# Patient Record
Sex: Female | Born: 1975 | Race: White | Hispanic: No | Marital: Single | Smoking: Never smoker
Health system: Southern US, Community
[De-identification: ages and names within clinical notes are randomized; demographics above are authoritative.]

## PROBLEM LIST (undated history)

## (undated) DIAGNOSIS — Z9889 Other specified postprocedural states: Secondary | ICD-10-CM

## (undated) DIAGNOSIS — J343 Hypertrophy of nasal turbinates: Secondary | ICD-10-CM

## (undated) DIAGNOSIS — R112 Nausea with vomiting, unspecified: Secondary | ICD-10-CM

## (undated) DIAGNOSIS — J342 Deviated nasal septum: Secondary | ICD-10-CM

## (undated) DIAGNOSIS — Z8489 Family history of other specified conditions: Secondary | ICD-10-CM

---

## 2001-02-09 HISTORY — PX: LASIK: SHX215

## 2011-01-19 ENCOUNTER — Encounter: Payer: Self-pay | Admitting: *Deleted

## 2011-01-19 ENCOUNTER — Emergency Department (HOSPITAL_COMMUNITY)
Admission: EM | Admit: 2011-01-19 | Discharge: 2011-01-20 | Disposition: A | Discharge status: Home / Self Care | Payer: BC Managed Care – PPO | Attending: Emergency Medicine | Admitting: Emergency Medicine

## 2011-01-19 DIAGNOSIS — R10811 Right upper quadrant abdominal tenderness: Secondary | ICD-10-CM | POA: Insufficient documentation

## 2011-01-19 DIAGNOSIS — Z79899 Other long term (current) drug therapy: Secondary | ICD-10-CM | POA: Insufficient documentation

## 2011-01-19 DIAGNOSIS — R1011 Right upper quadrant pain: Secondary | ICD-10-CM | POA: Insufficient documentation

## 2011-01-19 DIAGNOSIS — R110 Nausea: Secondary | ICD-10-CM | POA: Insufficient documentation

## 2011-01-19 DIAGNOSIS — M62838 Other muscle spasm: Secondary | ICD-10-CM | POA: Insufficient documentation

## 2011-01-19 DIAGNOSIS — R12 Heartburn: Secondary | ICD-10-CM | POA: Insufficient documentation

## 2011-01-19 LAB — URINALYSIS, ROUTINE W REFLEX MICROSCOPIC
Bilirubin Urine: NEGATIVE
Ketones, ur: NEGATIVE mg/dL
Leukocytes, UA: NEGATIVE
Nitrite: NEGATIVE
Specific Gravity, Urine: 1.006 (ref 1.005–1.030)
Urobilinogen, UA: 0.2 mg/dL (ref 0.0–1.0)
pH: 7 (ref 5.0–8.0)

## 2011-01-19 NOTE — ED Notes (Signed)
Right lower abdominal pressure,  right upper sharp abdominal pain, and nausea for two days

## 2011-01-20 ENCOUNTER — Emergency Department (HOSPITAL_COMMUNITY): Payer: BC Managed Care – PPO

## 2011-01-20 LAB — DIFFERENTIAL
Basophils Absolute: 0.1 10*3/uL (ref 0.0–0.1)
Basophils Relative: 1 % (ref 0–1)
Eosinophils Absolute: 0.1 10*3/uL (ref 0.0–0.7)
Eosinophils Relative: 1 % (ref 0–5)
Neutrophils Relative %: 46 % (ref 43–77)

## 2011-01-20 LAB — COMPREHENSIVE METABOLIC PANEL
ALT: 11 U/L (ref 0–35)
AST: 14 U/L (ref 0–37)
Albumin: 4.4 g/dL (ref 3.5–5.2)
Calcium: 9.5 mg/dL (ref 8.4–10.5)
GFR calc Af Amer: >90 mL/min (ref >90)
Potassium: 3.4 mEq/L — ABNORMAL LOW (ref 3.5–5.1)
Sodium: 138 mEq/L (ref 135–145)
Total Protein: 6.9 g/dL (ref 6.0–8.3)

## 2011-01-20 LAB — CBC
MCH: 32.6 pg (ref 26.0–34.0)
MCV: 90.2 fL (ref 78.0–100.0)
Platelets: 281 10*3/uL (ref 150–400)
RDW: 12.1 % (ref 11.5–15.5)

## 2011-01-20 MED ORDER — ONDANSETRON HCL 4 MG PO TABS: 4.0000 mg | ORAL_TABLET | Freq: Four times a day (QID) | ORAL | Status: DC

## 2011-01-20 MED ORDER — HYDROMORPHONE HCL PF 1 MG/ML IJ SOLN
1.0000 mg | Freq: Once | INTRAMUSCULAR | Status: AC
  Administered 2011-01-20: 1 mg via INTRAVENOUS
  Filled 2011-01-20: qty 1

## 2011-01-20 MED ORDER — SODIUM CHLORIDE 0.9 % IV BOLUS (SEPSIS)
1000.0000 mL | Freq: Once | INTRAVENOUS | Status: AC
  Administered 2011-01-20: 1000 mL via INTRAVENOUS

## 2011-01-20 MED ORDER — ONDANSETRON HCL 4 MG/2ML IJ SOLN
INTRAMUSCULAR | Status: AC
  Filled 2011-01-20: qty 2

## 2011-01-20 MED ORDER — ONDANSETRON HCL 4 MG/2ML IJ SOLN
4.0000 mg | Freq: Once | INTRAMUSCULAR | Status: AC
  Administered 2011-01-20: 4 mg via INTRAVENOUS
  Filled 2011-01-20: qty 2

## 2011-01-20 MED ORDER — IOHEXOL 300 MG/ML  SOLN
100.0000 mL | Freq: Once | INTRAMUSCULAR | Status: AC | PRN
  Administered 2011-01-20: 100 mL via INTRAVENOUS

## 2011-01-20 MED ORDER — PANTOPRAZOLE SODIUM 20 MG PO TBEC: 20.0000 mg | DELAYED_RELEASE_TABLET | Freq: Every day | ORAL | Status: DC

## 2011-01-20 MED ORDER — HYDROCODONE-ACETAMINOPHEN 5-500 MG PO TABS: 1.0000 | ORAL_TABLET | Freq: Four times a day (QID) | ORAL | Status: AC | PRN

## 2011-01-20 MED ORDER — PANTOPRAZOLE SODIUM 40 MG IV SOLR
40.0000 mg | Freq: Once | INTRAVENOUS | Status: AC
  Administered 2011-01-20: 40 mg via INTRAVENOUS
  Filled 2011-01-20: qty 40

## 2011-01-20 MED ORDER — ONDANSETRON HCL 4 MG/2ML IJ SOLN
4.0000 mg | Freq: Once | INTRAMUSCULAR | Status: AC
  Administered 2011-01-20: 4 mg via INTRAVENOUS

## 2011-01-20 NOTE — ED Provider Notes (Signed)
History     CSN: [id number] Arrival date & time: 01/19/2011  6:08 PM   First MD Initiated Contact with Patient 01/20/11 0128      Chief Complaint  Patient presents with  . Abdominal Pain    gallbladder    (Consider location/radiation/quality/duration/timing/severity/associated sxs/prior treatment) Patient is a 35 y.o. female presenting with abdominal pain. The history is provided by the patient.  Abdominal Pain The primary symptoms of the illness include abdominal pain. The current episode started yesterday. The onset of the illness was gradual. The problem has been gradually worsening.  Associated with: Started around 3 AM right side abdomen. Additional symptoms associated with the illness include heartburn. Symptoms associated with the illness do not include chills, anorexia, diaphoresis, constipation, urgency, hematuria, frequency or back pain. Significant associated medical issues do not include inflammatory bowel disease, diabetes or liver disease.   Sharp in nature mostly right upper quadrant also epigastric and left upper quadrant. No radiation to her back. No aggravating or alleviating factors. Pain does not seem to be associated with eating. Patient is worried about her gallbladder with family history of gallbladder disease. No fevers or trauma. She has had some recent heartburn and reflux. Patient had one episode of this pain last week that lasted less than a day. This episode started yesterday and has been constant since onset. Has associated nausea no vomiting  Past Medical History  Diagnosis Date  . Sinus infection     History reviewed. No pertinent past surgical history.  No family history on file.  History  Substance Use Topics  . Smoking status: Never Smoker   . Smokeless tobacco: Not on file  . Alcohol Use: No    OB History    Grav Para Term Preterm Abortions TAB SAB Ect Mult Living                  Review of Systems  Constitutional: Negative for chills  and diaphoresis.  Gastrointestinal: Positive for heartburn and abdominal pain. Negative for constipation and anorexia.  Genitourinary: Negative for urgency, frequency and hematuria.  Musculoskeletal: Negative for back pain.    Allergies  Ceclor; Codeine; and Erythromycin  Home Medications   Current Outpatient Rx  Name Route Sig Dispense Refill  . VITAMIN C 1000 MG PO TABS Oral Take 1,000 mg by mouth daily.      Marland Kitchen FLUTICASONE PROPIONATE 50 MCG/ACT NA SUSP Nasal Place 2 sprays into the nose daily as needed. Allergies     . GUAIFENESIN 400 MG PO TABS Oral Take 400 mg by mouth every 4 (four) hours as needed. Sinus infection.     . IBUPROFEN 200 MG PO TABS Oral Take 400 mg by mouth every 6 (six) hours as needed. Pain.     Marland Kitchen LORATADINE 10 MG PO TABS Oral Take 10 mg by mouth daily.      Marland Kitchen POLYETHYL GLYCOL-PROPYL GLYCOL 0.4-0.3 % OP SOLN Ophthalmic Apply 1 drop to eye daily as needed. Dry eyes....     . SALINE NASAL SPRAY 0.65 % NA SOLN Nasal Place 1 spray into the nose daily.        BP 103/53  Pulse 40  Temp(Src) 97.8 F (36.6 C) (Oral)  Resp 18  SpO2 100%  LMP 01/17/2011  Physical Exam  Constitutional: She is oriented to person, place, and time. She appears well-developed and well-nourished.  HENT:  Head: Normocephalic and atraumatic.  Eyes: Conjunctivae and EOM are normal. Pupils are equal, round, and reactive to light.  Neck: Trachea normal. Neck supple. No thyromegaly present.  Cardiovascular: Normal rate, regular rhythm, S1 normal, S2 normal and normal pulses.     No systolic murmur is present   No diastolic murmur is present  Pulses:      Radial pulses are 2+ on the right side, and 2+ on the left side.  Pulmonary/Chest: Effort normal and breath sounds normal. She has no wheezes. She has no rhonchi. She has no rales. She exhibits no tenderness.  Abdominal: Soft. Normal appearance and bowel sounds are normal. There is tenderness. There is no rebound, no CVA tenderness and  negative Murphy's sign.       Tender right upper quadrant with voluntary guarding no rebound.  Musculoskeletal:       BLE:s Calves nontender, no cords or erythema, negative Homans sign  Neurological: She is alert and oriented to person, place, and time. She has normal strength. No cranial nerve deficit or sensory deficit. GCS eye subscore is 4. GCS verbal subscore is 5. GCS motor subscore is 6.  Skin: Skin is warm and dry. No rash noted. She is not diaphoretic.  Psychiatric: Her speech is normal.       Cooperative and appropriate    ED Course  Procedures (including critical care time)  Labs Reviewed  CBC - Abnormal; Notable for the following:    MCHC 36.1 (*)    All other components within normal limits  COMPREHENSIVE METABOLIC PANEL - Abnormal; Notable for the following:    Potassium 3.4 (*)    All other components within normal limits  URINALYSIS, ROUTINE W REFLEX MICROSCOPIC  POCT PREGNANCY, URINE  DIFFERENTIAL  LIPASE, BLOOD  POCT PREGNANCY, URINE   US Abdomen Complete  01/20/2011  *RADIOLOGY REPORT*  Clinical Data:  Right upper quadrant abdominal pain.  ABDOMINAL ULTRASOUND COMPLETE  Comparison:  None  Findings:  Gallbladder:  The gallbladder is normal in appearance, without evidence for gallstones, gallbladder wall thickening or pericholecystic fluid.  No ultrasonographic Murphy's sign is elicited.  The fundus is not well characterized due to overlying bowel gas.  Common Bile Duct:  0.5 cm in diameter; within normal limits in caliber.  Liver: There is a mild "starry night" appearance to the liver, which could reflect mild hepatitis; suggest clinical correlation. Hepatic echogenicity otherwise remains within normal limits; no focal lesions identified.  Limited Doppler evaluation demonstrates normal blood flow within the liver.  IVC:  Unremarkable in appearance.  Pancreas:  Although the pancreas is difficult to visualize in its entirety due to overlying bowel gas, no focal pancreatic  abnormality is identified.  Spleen:  8.1 cm in length; within normal limits in size and echotexture.  Right kidney:  10.6 cm in length; normal in size, configuration and parenchymal echogenicity.  No evidence of mass or hydronephrosis.  Left kidney:  10.5 cm in length; normal in size, configuration and parenchymal echogenicity.  No evidence of mass or hydronephrosis.  Abdominal Aorta:  Normal in caliber; no aneurysm identified.  IMPRESSION:  1.  Gallbladder unremarkable in appearance. 2.  Mild "starry night" appearance to the liver may reflect mild hepatitis; suggest clinical correlation.  Original Report Authenticated By: Tonia Ghent, M.D.   Ct Abdomen Pelvis W Contrast  01/20/2011  *RADIOLOGY REPORT*  Clinical Data: Right-sided abdominal pain and right upper quadrant muscle spasms.  Nausea.  CT ABDOMEN AND PELVIS WITH CONTRAST  Technique:  Multidetector CT imaging of the abdomen and pelvis was performed following the standard protocol during bolus administration of intravenous contrast.  Contrast: OMNIPAQUE IOHEXOL 300 MG/ML IV SOLN  Comparison: Abdominal ultrasound performed earlier today at 02:10 a.m.  Findings: Minimal bibasilar atelectasis is noted.  There is diffuse periportal edema.  Trace fluid is noted about the inferior tip of the liver.  There is also significant pericholecystic fluid, which was not present on the recent abdominal ultrasound.  Given prominence of the IVC and recent history of rehydration, this may reflect volume repletion.  The unusual mildly heterogeneous appearance to hepatic enhancement may also reflect volume repletion, as lab values demonstrate no evidence for hepatitis.  The spleen is unremarkable in appearance.  The adrenal glands and pancreas are within normal limits.  The gallbladder is otherwise unremarkable.  The kidneys are unremarkable in appearance.  There is no evidence of hydronephrosis.  No renal or ureteral stones are seen.  No perinephric stranding is  appreciated.  No free fluid is identified.  The small bowel is unremarkable in appearance.  The stomach is filled with contrast and is within normal limits.  No acute vascular abnormalities are seen.  The appendix is normal in caliber, without evidence for appendicitis.  The colon is partially decompressed; mild diverticulosis is noted at the proximal sigmoid colon, without evidence for diverticulitis.  The bladder is mildly distended and unremarkable in appearance. The uterus is within normal limits.  A tampon is noted at the vagina.  The ovaries are relatively symmetric; no suspicious adnexal masses are seen.  No inguinal lymphadenopathy is seen.  No acute osseous abnormalities are identified.  IMPRESSION:  1.  Unusual mildly heterogeneous appearance to hepatic parenchymal enhancement; this may reflect volume repletion as described above, since lab values demonstrate no evidence for hepatitis. 2.  Significant pericholecystic fluid, new from the recent abdominal ultrasound; this is nonspecific in the presence of trace ascites about the liver.  Gallbladder otherwise unremarkable in appearance. 3.  Diffuse periportal edema; this likely reflects volume repletion. 4.  Mild diverticulosis at the proximal sigmoid colon, without evidence for diverticulitis.  Original Report Authenticated By: Tonia Ghent, M.D.     Diagnosis: Abdominal pain   MDM   Patient with right upper quadrant abdominal pain, evaluated with ultrasound and labs as above. Ultrasound unremarkable and still in significant pain. IV narcotics and fluids. By mouth contrast and CT scan as above. At 6:50 AM patient notified of CT results, his feeling much better, and is stable for discharge home. Outpatient referral and followup recommended. Patient states understanding strict return precautions        Sunnie Nielsen, MD 01/20/11 0700

## 2011-01-20 NOTE — ED Notes (Signed)
Pt c/o ruq pain, onset was several weeks ago, pt believes r/t gallbladder, onset after "greasy food"

## 2011-01-20 NOTE — ED Notes (Signed)
U/S bedside, cont to monitor

## 2011-01-22 ENCOUNTER — Telehealth: Payer: Self-pay | Admitting: Internal Medicine

## 2011-01-22 NOTE — Telephone Encounter (Signed)
Spoke with pt' sister, Inetta Fermo, and informed her I have pt scheduled for Friday, 03/27/10 at 10:45am. I will call if an opening occurs. Inetta Fermo stated understanding.

## 2011-01-23 ENCOUNTER — Encounter: Payer: Self-pay | Admitting: Internal Medicine

## 2011-01-24 ENCOUNTER — Telehealth: Payer: Self-pay | Admitting: Internal Medicine

## 2011-01-24 ENCOUNTER — Other Ambulatory Visit (INDEPENDENT_AMBULATORY_CARE_PROVIDER_SITE_OTHER): Payer: BC Managed Care – PPO

## 2011-01-24 ENCOUNTER — Encounter: Payer: Self-pay | Admitting: Internal Medicine

## 2011-01-24 ENCOUNTER — Ambulatory Visit (INDEPENDENT_AMBULATORY_CARE_PROVIDER_SITE_OTHER): Payer: BC Managed Care – PPO | Admitting: Internal Medicine

## 2011-01-24 VITALS — BP 102/58 | HR 60 | Ht 63.0 in | Wt 156.2 lb

## 2011-01-24 DIAGNOSIS — R1013 Epigastric pain: Secondary | ICD-10-CM

## 2011-01-24 DIAGNOSIS — R112 Nausea with vomiting, unspecified: Secondary | ICD-10-CM

## 2011-01-24 LAB — CBC WITH DIFFERENTIAL/PLATELET
Basophils Relative: 0.7 % (ref 0.0–3.0)
Eosinophils Absolute: 0 10*3/uL (ref 0.0–0.7)
HCT: 36.6 % (ref 36.0–46.0)
Hemoglobin: 12.5 g/dL (ref 12.0–15.0)
Lymphocytes Relative: 35.7 % (ref 12.0–46.0)
Lymphs Abs: 1.4 10*3/uL (ref 0.7–4.0)
MCHC: 34.2 g/dL (ref 30.0–36.0)
MCV: 96.4 fl (ref 78.0–100.0)
Neutro Abs: 2.2 10*3/uL (ref 1.4–7.7)
RBC: 3.8 Mil/uL — ABNORMAL LOW (ref 3.87–5.11)

## 2011-01-24 LAB — COMPREHENSIVE METABOLIC PANEL
AST: 14 U/L (ref 0–37)
Albumin: 4 g/dL (ref 3.5–5.2)
Alkaline Phosphatase: 45 U/L (ref 39–117)
BUN: 5 mg/dL — ABNORMAL LOW (ref 6–23)
Calcium: 8.9 mg/dL (ref 8.4–10.5)
Chloride: 108 mEq/L (ref 96–112)
Glucose, Bld: 85 mg/dL (ref 70–99)
Potassium: 3.7 mEq/L (ref 3.5–5.1)
Sodium: 145 mEq/L (ref 135–145)
Total Protein: 6.6 g/dL (ref 6.0–8.3)

## 2011-01-24 MED ORDER — ONDANSETRON HCL 4 MG PO TABS: 4.0000 mg | ORAL_TABLET | Freq: Four times a day (QID) | ORAL | Status: AC | PRN

## 2011-01-24 MED ORDER — HYOSCYAMINE SULFATE 0.125 MG SL SUBL: 0.1250 mg | SUBLINGUAL_TABLET | SUBLINGUAL | Status: AC | PRN

## 2011-01-24 MED ORDER — PANTOPRAZOLE SODIUM 20 MG PO TBEC: 40.0000 mg | DELAYED_RELEASE_TABLET | Freq: Every day | ORAL | Status: DC

## 2011-01-24 NOTE — Progress Notes (Signed)
Subjective:    Patient ID: Kimberly Gonzalez, female    DOB: October 29, 1975, 35 y.o.   MRN: [id number]  HPI Kimberly Gonzalez is a 35 yo female with limited past medical history who presents today with her sister on referral from the emergency department for evaluation of epigastric pain.  The patient states her symptoms started approximately 3 or 4 months ago. She reports her first episode woke her from sleep at around 3 AM. At that time she reports epigastric "muscle spasm pain". She describes this pain is extremely intense and associated with nausea and vomiting. The pain seemed to build and lasted about 12 hours. It resolved and left her with soreness. The pain again occurred about 2 months later again in the middle of the night, with epigastric pain, nausea and vomiting.  She then had a period with no pain until mid-October 2012 1 again the middle of the night, she developed pain nausea and vomiting. She specifically remembers eating greasy foods prior to the pain starting and she wonders if there is any relation. She was seen by her PCP and advised that should the pain return she should seek evaluation. Then on around November 7 she again developed abdominal pain primarily in the epigastrium and right upper quadrant. There was no nausea and vomiting associated with this episode. She was seen in the ED and underwent ultrasound and CT scan. She reports at that time labs were normal.  Currently she is not hurting, but is concerned that the pain may return. She reports her appetite overall has been poor, but she has been able to eat. She has not lost weight, in fact she has gained weight. She denies early satiety. She has noted some heartburn recently (over the past month), but no dysphagia or odynophagia.  Her bowel movements have been relatively normal for her, though occasionally slightly loose. No melena or bright red blood per rectum. No diarrhea.  Recently she was given a prescription for pantoprazole 20 mg  daily, but she's only taken this for 2 days. She has used promethazine as needed for nausea with some relief. She also has tried ibuprofen for epigastric abdominal pain without much help. She was given a prescription for Vicodin for severe pain which she has used infrequently with some benefit.   Review of Systems Constitutional: Negative for fever, chills, night sweats, activity change HEENT: Negative for sore throat, mouth sores and trouble swallowing. Eyes: Negative for visual disturbance Respiratory: Negative for cough, chest tightness and shortness of breath Cardiovascular: Negative for chest pain, palpitations and lower extremity swelling Gastrointestinal: See history of present illness Genitourinary: Negative for dysuria and hematuria. Musculoskeletal: Negative for back pain, arthralgias and myalgias Skin: Negative for rash or color change Neurological: Negative for headaches, weakness, numbness Hematological: Negative for adenopathy, negative for easy bruising/bleeding Psychiatric/behavioral: Negative for depressed mood, negative for anxiety  PMH: None   Social History  . Marital Status: Single   Occupational History  . Data processing manager at [employer]    Social History Main Topics  . Smoking status: Never Smoker   . Smokeless tobacco: Never Used  . Alcohol Use: No  . Drug Use: No   Family History  Problem Relation Age of Onset  . Diabetes Father   . Kidney disease Sister   . Leukemia Father   . Diabetes Paternal Aunt   . Diabetes Paternal Uncle       Objective:   Physical Exam BP 102/58  Pulse 60  Ht 5\' 3"  (1.6 m)  Wt 70.852 kg (156 lb 3.2 oz)  BMI 27.67 kg/m2  LMP 01/17/2011 Constitutional: Well-developed and well-nourished. No distress. HEENT: Normocephalic and atraumatic. Oropharynx is clear and moist. No oropharyngeal exudate. Conjunctivae are normal. Pupils are equal round and reactive to light. No scleral icterus. Neck: Neck supple. Trachea  midline. Cardiovascular: Normal rate, regular rhythm and intact distal pulses. No M/R/G Pulmonary/chest: Effort normal and breath sounds normal. No wheezing, rales or rhonchi. Abdominal: Soft, mild epigastric and right upper quadrant pain without rebound or guarding, nondistended. Bowel sounds active throughout. There are no masses palpable. No hepatosplenomegaly. Extremities: no clubbing, cyanosis, or edema Lymphadenopathy: No cervical adenopathy noted. Neurological: Alert and oriented to person place and time. Skin: Skin is warm and dry. No rashes noted. Psychiatric: Normal mood and affect. Behavior is normal.  CMP     Component Value Date/Time   NA 145 01/24/2011 1250   K 3.7 01/24/2011 1250   CL 108 01/24/2011 1250   CO2 28 01/24/2011 1250   GLUCOSE 85 01/24/2011 1250   BUN 5* 01/24/2011 1250   CREATININE 0.7 01/24/2011 1250   CALCIUM 8.9 01/24/2011 1250   PROT 6.6 01/24/2011 1250   ALBUMIN 4.0 01/24/2011 1250   AST 14 01/24/2011 1250   ALT 12 01/24/2011 1250   ALKPHOS 45 01/24/2011 1250   BILITOT 0.6 01/24/2011 1250   GFRNONAA >90 01/20/2011 0150   GFRAA >90 01/20/2011 0150    CBC    Component Value Date/Time   WBC 4.0* 01/24/2011 1250   RBC 3.80* 01/24/2011 1250   HGB 12.5 01/24/2011 1250   HCT 36.6 01/24/2011 1250   PLT 256.0 01/24/2011 1250   MCV 96.4 01/24/2011 1250   MCH 32.6 01/20/2011 0150   MCHC 34.2 01/24/2011 1250   RDW 12.8 01/24/2011 1250   LYMPHSABS 1.4 01/24/2011 1250   MONOABS 0.3 01/24/2011 1250   EOSABS 0.0 01/24/2011 1250   BASOSABS 0.0 01/24/2011 1250    Imaging: RUQ [place] 01/20/11 1. gallbladder are unremarkable in appearance. 2. mild "starry night" appearance of the liver may reflect mild hepatitis, suggest clinical correlation  CT abdomen and pelvis with contrast 01/20/2011 1. unusual mildly heterogeneous appearance of the hepatic parenchymal enhancement; this may reflect volume repletion as described above, since lab values demonstrate  no evidence for hepatitis. 2. significant pericholecystic fluid, new from the recent abdominal ultrasound; this is nonspecific in the presence of trace ascites about the liver. Gallbladder otherwise unremarkable in appearance. 3. Diffuse periportal edema; this likely reflects volume repletion 4. mild diverticulosis at the proximal sigmoid colon, without evidence for diverticulitis     Assessment & Plan:  35 yo female with limited past medical history who presents today with her sister on referral from the emergency department for evaluation of epigastric pain  1. Abdominal pain/N/V -- the patient's abdominal pain and nausea and vomiting have been very episodic in nature. Interestingly her labs have been normal, and remain so today (comprehensive metabolic panel and CBC results available above from today) .  Her pain certainly could be biliary in nature, but her gallbladder  was normal by ultrasound with no evidence of stones. She did have pericholecystic fluid on the CT scan, but this was done just after the ultrasound, raising suspicion that fluid administration adequate to this finding.  The changes seen on imaging in her liver are not correlated with abnormal liver function tests, therefore making this finding nonspecific. At present, I think the differential includes acid peptic disease versus gallbladder disease. I agree with  PPI therapy, and will increase her dose of pantoprazole to 40 mg daily. We discussed how best to take this medication, specifically 30 minutes to one hour before breakfast. She can continue to use antibiotics, and I will give her prescription for Zofran as this is likely less sedating than promethazine. I also will give her a prescription for Levsin to be used as needed for spasm/pain. Finally I recommended CCK-HIDA to assess gallbladder function and to see if CCK administration reproduces her pain. If this test is abnormal or her pain is reproduced, then I will refer her to  general surgery for consideration of cholecystectomy. Also we will perform upper endoscopy to rule out acid peptic disease in her stomach or proximal small bowel. I've asked that she call me should her symptoms worsen prior to the testing ordered today.

## 2011-01-24 NOTE — Patient Instructions (Addendum)
You have been scheduled for a HIDA scan at [facility] on 02/08/2011 @ 2:00pm arrive 15 min. Prior to your appointment. You are to have nothing to eat or drink 6 hours prior to your appointment. If you need to cancel or resschedule please call  department at [phone] between the hours of 8:00 am and 5:00 pm, Monday-Friday.  Your physician has requested that you go to the basement for lab work before leaving today.  We have sent the following medications to your pharmacy for you to pick up at your convenience: levisen. Protonix and Zofran.

## 2011-01-25 ENCOUNTER — Telehealth: Payer: Self-pay | Admitting: Internal Medicine

## 2011-01-25 ENCOUNTER — Telehealth: Payer: Self-pay | Admitting: Gastroenterology

## 2011-01-25 ENCOUNTER — Telehealth: Payer: Self-pay | Admitting: *Deleted

## 2011-01-25 NOTE — Telephone Encounter (Signed)
This call was from the pt's sister and I gave her the results.

## 2011-01-25 NOTE — Telephone Encounter (Signed)
Message copied by Florene Glen on Thu Jan 25, 2011  2:35 PM ------      Message from: Beverley Fiedler      Created: Wed Jan 24, 2011  4:28 PM       Labs remain normal.  WBC  Count technically low, but not dangerously so and this is likely normal for her.      Liver test very normal.  Thyroid study normal.

## 2011-01-25 NOTE — Telephone Encounter (Signed)
Informed pt of Dr Lauro Franklin interpretation of her labs; pt stated understanding and will have a CCK HIDA scan on 01/29/11 and possible EGD.

## 2011-01-25 NOTE — Telephone Encounter (Signed)
Spoke to pt. Confirmed her appt. At [facility] for a CCK-Hida scan at 1pm. Let her know she needs to arrive at 12:45pm and NPO after 7am. She understood.

## 2011-01-26 ENCOUNTER — Ambulatory Visit: Payer: BC Managed Care – PPO | Admitting: Gastroenterology

## 2011-01-29 ENCOUNTER — Encounter (HOSPITAL_COMMUNITY)
Admission: RE | Admit: 2011-01-29 | Discharge: 2011-01-29 | Disposition: A | Discharge status: Home / Self Care | Payer: BC Managed Care – PPO | Source: Ambulatory Visit | Attending: Internal Medicine | Admitting: Internal Medicine

## 2011-01-29 DIAGNOSIS — R1013 Epigastric pain: Secondary | ICD-10-CM | POA: Insufficient documentation

## 2011-01-29 MED ORDER — TECHNETIUM TC 99M MEBROFENIN IV KIT
5.0000 | PACK | Freq: Once | INTRAVENOUS | Status: AC | PRN
  Administered 2011-01-29: 5 via INTRAVENOUS

## 2011-01-29 MED ORDER — SINCALIDE 5 MCG IJ SOLR
INTRAMUSCULAR | Status: AC
  Administered 2011-01-29: 1.36 ug via INTRAVENOUS
  Filled 2011-01-29: qty 5

## 2011-01-30 ENCOUNTER — Telehealth: Payer: Self-pay | Admitting: *Deleted

## 2011-01-30 NOTE — Telephone Encounter (Signed)
Pt's sister called to see if Hida scan showed anything so she could cancel the EGD if he Gall Bladder is her problem. Notified Inetta Fermo that the Hida scan was normal; unless something changes she will have her EGD next week.

## 2011-02-06 ENCOUNTER — Ambulatory Visit (AMBULATORY_SURGERY_CENTER): Payer: BC Managed Care – PPO | Admitting: Internal Medicine

## 2011-02-06 ENCOUNTER — Encounter: Payer: Self-pay | Admitting: Internal Medicine

## 2011-02-06 DIAGNOSIS — R112 Nausea with vomiting, unspecified: Secondary | ICD-10-CM

## 2011-02-06 DIAGNOSIS — R1013 Epigastric pain: Secondary | ICD-10-CM

## 2011-02-06 MED ORDER — SODIUM CHLORIDE 0.9 % IV SOLN: 500.0000 mL | INTRAVENOUS | Status: DC

## 2011-02-06 NOTE — Progress Notes (Signed)
Patient did not experience any of the following events: a burn prior to discharge; a fall within the facility; wrong site/side/patient/procedure/implant event; or a hospital transfer or hospital admission upon discharge from the facility. (G8907) Patient did not have preoperative order for IV antibiotic SSI prophylaxis. ([id number])  

## 2011-02-06 NOTE — Patient Instructions (Signed)
Discharge instructions per blue and green sheets Handout on hiatal hernia Continue your reflux medicine daily as directed. Take this 20-30 minutes before your first meal of the day If the pain recurs, please contact dr pyrtles office at [phone].

## 2011-02-07 ENCOUNTER — Telehealth: Payer: Self-pay | Admitting: *Deleted

## 2011-02-07 NOTE — Telephone Encounter (Signed)

## 2011-02-08 ENCOUNTER — Other Ambulatory Visit (HOSPITAL_COMMUNITY): Payer: BC Managed Care – PPO

## 2011-03-09 ENCOUNTER — Other Ambulatory Visit: Payer: Self-pay | Admitting: Internal Medicine

## 2011-03-09 MED ORDER — PANTOPRAZOLE SODIUM 40 MG PO TBEC: DELAYED_RELEASE_TABLET | ORAL | Status: DC

## 2011-03-09 NOTE — Telephone Encounter (Signed)
Dr Rhea Belton, pt stated she has had no more problems, but she has watched her diet and held off on the [place] food. She did 30 days of Protonix 40mg  bid; should she continue this? Thanks.

## 2011-03-09 NOTE — Telephone Encounter (Addendum)
Pt with a normal EGD on 02/05/11 and note stated for her to continue to take her PPI 30 minutes prior to meals; she is to contact our ofc if abdominal pain recurs.

## 2011-03-09 NOTE — Telephone Encounter (Signed)
lmom at [phone] per Dr Rhea Belton, she can continue the Protonix if symptoms persist and only once a day. Ordered med at [facility] and she can pick it up or keep it on hold in case she needs it. She may call back for further questions.

## 2011-03-09 NOTE — Telephone Encounter (Signed)
I recommend she only continue Protonix if symptoms persist. Once a day is likely sufficient if her symptoms return.

## 2011-03-09 NOTE — Telephone Encounter (Signed)
See previous note

## 2011-05-03 NOTE — Telephone Encounter (Signed)
completed

## 2011-08-09 ENCOUNTER — Telehealth: Payer: Self-pay | Admitting: Internal Medicine

## 2011-08-09 NOTE — Telephone Encounter (Signed)
See if she can be seen by [facility] tomorrow, thanks.  Continue protonix 40 mg daily.

## 2011-08-09 NOTE — Telephone Encounter (Signed)
Pt has not been seen since November of 2012. She had episodes of upper epigastric/abdominal pain and nothing was ever found. Negative CCK HIDA an only a small HH on EGD. She was told to f/u if muscle spasms started again. She states she was told to take Protonix PRN. Pt reports she has watched her diet and cut out OJ, coffee and fried foods and hadn't been taking any protonix. 1st weekend in May, she ate cheese sticks and woke up in the middle of the night at 4am with the same kind of pain and spasms. She will have nausea and vomit for hours and have the cramping in her upper l&r area below her breasts. The episodes will be followed by BMs that are softer, not diarrhea. She had another episode yesterday after having OJ and [employer]. She got the Protonix refilled and has been taking that and she hasn't had the vomiting she normally has; could be coincidence. Pt is willing to come in or do further testing; please advise. Thanks.

## 2011-08-10 ENCOUNTER — Ambulatory Visit (INDEPENDENT_AMBULATORY_CARE_PROVIDER_SITE_OTHER): Payer: BC Managed Care – PPO | Admitting: Nurse Practitioner

## 2011-08-10 ENCOUNTER — Encounter: Payer: Self-pay | Admitting: Nurse Practitioner

## 2011-08-10 ENCOUNTER — Other Ambulatory Visit (INDEPENDENT_AMBULATORY_CARE_PROVIDER_SITE_OTHER): Payer: BC Managed Care – PPO

## 2011-08-10 VITALS — BP 112/72 | HR 77 | Ht 63.0 in | Wt 149.8 lb

## 2011-08-10 DIAGNOSIS — R933 Abnormal findings on diagnostic imaging of other parts of digestive tract: Secondary | ICD-10-CM

## 2011-08-10 DIAGNOSIS — R1013 Epigastric pain: Secondary | ICD-10-CM

## 2011-08-10 LAB — HEPATIC FUNCTION PANEL
Alkaline Phosphatase: 55 U/L (ref 39–117)
Bilirubin, Direct: 0.1 mg/dL (ref 0.0–0.3)
Total Bilirubin: 0.6 mg/dL (ref 0.3–1.2)

## 2011-08-10 NOTE — Patient Instructions (Signed)
Please go to the basement level to have your labs drawn.  We will be calling you with results.

## 2011-08-10 NOTE — Telephone Encounter (Signed)
Pt will see Gunnar Fusi today at 11am.

## 2011-08-10 NOTE — Progress Notes (Signed)
Conna Terada [id number] Oct 20, 1975   HISTORY OR PRESENT ILLNESS :  Ms. Spadafora is a 36 yo female with no significant medical history presenting for evaluation of upper abdominal pain, nausea and vomiting. Patient was initially evaluated by Dr. Rhea Belton in November 2012 after ED referred to our office. A CTscan done in the ED revealed diffuse periportal edema and significant pericholecystic fluid. LFTs were normal. EGD, HIDA scan and labs subsequent to that visit were normal.  Patient was advised to stay on a daily PPI. Between Protonix and a bland diet patient did okay until recently. Since slowly trying to incorporate some fat back into her diet patient has been having recurrent epigastric / RUQ pain, nausea and vomiting. Pain feels almost like muscle spasms, it may last several hours and is usually followed by nausea and vomiting. Patient concerned as episodes, when they occur, are affecting her ability to work.  urrent Medications, Allergies, Past Medical History, Past Surgical History, Family History and Social History were reviewed in [employer] record.   PHYSICAL EXAMINATION : General:  Well developed  female in no acute distress Head: Normocephalic and atraumatic Eyes:  sclerae anicteric,conjunctive pink. Ears: Normal auditory acuity Neck: Supple, no masses.  Lungs: Clear throughout to auscultation Heart: Regular rate and rhythm; no murmurs heard Abdomen: Soft, nondistended, mild diffuse lower abdominal tenderness  No masses or hepatomegaly noted. Normal bowel sounds Rectal: not done Musculoskeletal: Symmetrical with no gross deformities  Skin: No lesions on visible extremities Extremities: No edema or deformities noted Neurological: Oriented grossly nonfocal Cervical Nodes:  No significant cervical adenopathy Psychological:  Alert and cooperative. Normal mood and affect  ASSESSMENT AND PLAN :   Intermittent epigastric / RUQ pain, nausea and vomiting which  patient can definitely correlate with consumption of fatty foods. EGD negative. Her HIDA scan was normal with a normal gallbladder ejection fraction. Though she currently has no objective findings for gallbladder disease, her symptoms do raise the question. In addition, in November her CTscan scan revealed significant pericholecystic fluid which may, or may not, be clinically significant, especially since it was felt to possibly represent volume repletion. I think at this point a surgical evaluation would be helpful and patient is agreeable.

## 2011-08-10 NOTE — Telephone Encounter (Signed)
lmom for pt to call back; we can try to work her in with Willette Cluster, NP if she feels she needs to be seen today.

## 2011-08-13 ENCOUNTER — Encounter: Payer: Self-pay | Admitting: Nurse Practitioner

## 2011-08-13 NOTE — Progress Notes (Signed)
i agree with surgical opinion, GB disease can be very difficult to diagnose at time

## 2011-08-14 ENCOUNTER — Telehealth: Payer: Self-pay | Admitting: *Deleted

## 2011-08-14 ENCOUNTER — Other Ambulatory Visit: Payer: Self-pay | Admitting: *Deleted

## 2011-08-14 DIAGNOSIS — R933 Abnormal findings on diagnostic imaging of other parts of digestive tract: Secondary | ICD-10-CM

## 2011-08-14 DIAGNOSIS — R1110 Vomiting, unspecified: Secondary | ICD-10-CM

## 2011-08-14 DIAGNOSIS — R110 Nausea: Secondary | ICD-10-CM

## 2011-08-14 DIAGNOSIS — R1011 Right upper quadrant pain: Secondary | ICD-10-CM

## 2011-08-14 NOTE — Telephone Encounter (Signed)
I sent a staff message to [place] @ [facility] asking for her to view by Amb referral to General Surgery. We were asking for appt for this pt to rule out gallbladder disease per Dr. Rob Bunting and Willette Cluster ACNP.  There are recent labs and office visit but the imaging is from 2012.  Elane Fritz said the patient cannot be seen for a consultation until she has some current imaging done.  I called Willette Cluster ACNP and she told me not to worry about it. She said she or one of the docs may have to call [facility].  I told her to let me know if there is anything she wants me to do.

## 2011-08-15 ENCOUNTER — Telehealth: Payer: Self-pay | Admitting: Gastroenterology

## 2011-08-16 ENCOUNTER — Telehealth: Payer: Self-pay | Admitting: Nurse Practitioner

## 2011-08-16 NOTE — Telephone Encounter (Signed)
Spoke with Willette Cluster, NP and Dr. Rhea Belton is calling [facility] to talk with surgeon about getting patient scheduled for OV. Left a message for patient to call me.

## 2011-08-16 NOTE — Telephone Encounter (Signed)
Patient given information as per Willette Cluster, NP. Patient still having pain and nausea. She is trying to work and having a difficult time. She would like this appointment as soon as possible with surgeon.

## 2011-08-17 NOTE — Telephone Encounter (Signed)
See telephone note dated 08-16-2011.

## 2011-08-20 ENCOUNTER — Telehealth: Payer: Self-pay | Admitting: *Deleted

## 2011-08-20 NOTE — Telephone Encounter (Signed)
Called [facility] and asked if this pt had an appointment scheduled yet. I was told no.  I then sent a message to Willette Cluster ACNP and let her know the pt does not have an appointment .  I asked Gunnar Fusi if Dr. Rhea Belton called [facility]  yet.  She mentioned previously he may have to call and speak to a surgeon about the pt.

## 2011-08-21 ENCOUNTER — Telehealth: Payer: Self-pay | Admitting: *Deleted

## 2011-08-21 DIAGNOSIS — R1013 Epigastric pain: Secondary | ICD-10-CM

## 2011-08-21 NOTE — Telephone Encounter (Signed)
Patient given appointment date and time for ultrasound.

## 2011-08-21 NOTE — Telephone Encounter (Signed)
Message copied by Daphine Deutscher on Tue Aug 21, 2011  4:43 PM ------      Message from: Meredith Pel      Created: Tue Aug 21, 2011  4:34 PM      Regarding: Rene Kocher, please schedule her for ultrasound or ask Pyrtle's nurse to so we can get her appt. with surgery . thanks                   ----- Message -----         From: Beverley Fiedler, MD         Sent: 08/20/2011   8:40 AM           To: Meredith Pel, NP            Lazarus Salines repeat the [place], not just to satisfy them, but it is reasonable and they would do it anyway before surgery.      Then I will contact one of them.       Let me know when it is done            ----- Message -----         From: Meredith Pel, NP         Sent: 08/14/2011   3:59 PM           To: Beverley Fiedler, MD            Vonna Kotyk,       Will you send a note or call one of the surgeons about this patient. Office staff will not let us make her an appt because she doesn't have any current imaging. I don't see need to repeat imaging studies. She is having SAME symptoms as before. Thanks

## 2011-08-21 NOTE — Telephone Encounter (Signed)
Ultrasound of abdomen scheduled at [facility] radiology on 08/23/11 at 8:45/9:00 AM. NPO after midnight. Left a message for patient to call me back.

## 2011-08-23 ENCOUNTER — Ambulatory Visit (HOSPITAL_COMMUNITY)
Admission: RE | Admit: 2011-08-23 | Discharge: 2011-08-23 | Disposition: A | Discharge status: Home / Self Care | Payer: BC Managed Care – PPO | Source: Ambulatory Visit | Attending: Internal Medicine | Admitting: Internal Medicine

## 2011-08-23 DIAGNOSIS — R1013 Epigastric pain: Secondary | ICD-10-CM

## 2011-08-23 NOTE — Telephone Encounter (Signed)
The patient is having an Abdominal Ultrasound today 08-23-2011.  Gunnar Fusi had routed a note to Dr. Rhea Belton and he had Rene Kocher order an abdominal ultrasound. [facility] would not allow [place] to schedule an appointment with [facility] until the patient had current imaging done.  Dr Rhea Belton will review the results and will get an appointment for the patient to have a consult with a surgeion.

## 2011-08-29 ENCOUNTER — Telehealth: Payer: Self-pay | Admitting: *Deleted

## 2011-08-29 NOTE — Telephone Encounter (Signed)
LM for the patient to be sure she knew about her appt with Dr. Gaynelle Adu on 09-26-2011 at 9:15 AM at [facility].  She is also on the waiting list for a sooner appt.

## 2011-08-29 NOTE — Telephone Encounter (Signed)
Message copied by Derry Skill on Wed Aug 29, 2011  2:59 PM ------      Message from: Marnette Burgess      Created: Tue Aug 28, 2011  2:00 PM      Regarding: Referral       Patient is scheduled to see Dr. Gaynelle Adu on 09/26/11 @ 9:45am, arrive @ 9:15am.  If you have any questions please call [phone].  I will also put her on a waiting list incase something sooner comes available.            Thank You,      Elane Fritz      ----- Message -----         From: Derry Skill, CMA         Sent: 08/28/2011  11:13 AM           To: Marnette Burgess            [place],      I had tried to get an appointment for this patient who was seen here on 08-10-2011.  We couldn't get her an appointment for nausea & vomiting and abdominal pain because she had not had any imaging done.  Dr. Erick Blinks and Willette Cluster ACNP reviewed her information and they ordered an abdominal Ultrasound which she had done recently .        We are asking for an appointment for this patient who is still having these problems.              Thanks for your help,            Lowry Ram CMA      [facility]

## 2011-09-04 ENCOUNTER — Telehealth: Payer: Self-pay | Admitting: Internal Medicine

## 2011-09-04 NOTE — Telephone Encounter (Signed)
Informed pt of normal U/S and informed her of her appt with Dr Gaynelle Adu on 09/26/11 at 09:45am. Pt stated understanding.

## 2011-09-06 ENCOUNTER — Ambulatory Visit (INDEPENDENT_AMBULATORY_CARE_PROVIDER_SITE_OTHER): Payer: BC Managed Care – PPO | Admitting: General Surgery

## 2011-09-06 ENCOUNTER — Encounter (INDEPENDENT_AMBULATORY_CARE_PROVIDER_SITE_OTHER): Payer: Self-pay | Admitting: General Surgery

## 2011-09-06 VITALS — BP 125/78 | HR 76 | Temp 98.0°F | Ht 63.0 in | Wt 149.8 lb

## 2011-09-06 DIAGNOSIS — R1013 Epigastric pain: Secondary | ICD-10-CM

## 2011-09-06 NOTE — Patient Instructions (Signed)
Your symptoms are very consistent with gallbladder attacks. Your workup to date  is nondiagnostic. We have discussed numerous options, and you have elected to proceed with cholecystectomy. You are aware that this may or may not resolve your symptoms.  We will schedule the surgery electively in the near future.   Laparoscopic Cholecystectomy Laparoscopic cholecystectomy is surgery to remove the gallbladder. The gallbladder is located slightly to the right of center in the abdomen, behind the liver. It is a concentrating and storage sac for the bile produced in the liver. Bile aids in the digestion and absorption of fats. Gallbladder disease (cholecystitis) is an inflammation of your gallbladder. This condition is usually caused by a buildup of gallstones (cholelithiasis) in your gallbladder. Gallstones can block the flow of bile, resulting in inflammation and pain. In severe cases, emergency surgery may be required. When emergency surgery is not required, you will have time to prepare for the procedure. Laparoscopic surgery is an alternative to open surgery. Laparoscopic surgery usually has a shorter recovery time. Your common bile duct may also need to be examined and explored. Your caregiver will discuss this with you if he or she feels this should be done. If stones are found in the common bile duct, they may be removed. LET YOUR CAREGIVER KNOW ABOUT:  Allergies to food or medicine.   Medicines taken, including vitamins, herbs, eyedrops, over-the-counter medicines, and creams.   Use of steroids (by mouth or creams).   Previous problems with anesthetics or numbing medicines.   History of bleeding problems or blood clots.   Previous surgery.   Other health problems, including diabetes and kidney problems.   Possibility of pregnancy, if this applies.  RISKS AND COMPLICATIONS All surgery is associated with risks. Some problems that may occur following this procedure include:  Infection.    Damage to the common bile duct, nerves, arteries, veins, or other internal organs such as the stomach or intestines.   Bleeding.   A stone may remain in the common bile duct.  BEFORE THE PROCEDURE  Do not take aspirin for 3 days prior to surgery or blood thinners for 1 week prior to surgery.   Do not eat or drink anything after midnight the night before surgery.   Let your caregiver know if you develop a cold or other infectious problem prior to surgery.   You should be present 60 minutes before the procedure or as directed.  PROCEDURE  You will be given medicine that makes you sleep (general anesthetic). When you are asleep, your surgeon will make several small cuts (incisions) in your abdomen. One of these incisions is used to insert a small, lighted scope (laparoscope) into the abdomen. The laparoscope helps the surgeon see into your abdomen. Carbon dioxide gas will be pumped into your abdomen. The gas allows more room for the surgeon to perform your surgery. Other operating instruments are inserted through the other incisions. Laparoscopic procedures may not be appropriate when:  There is major scarring from previous surgery.   The gallbladder is extremely inflamed.   There are bleeding disorders or unexpected cirrhosis of the liver.   A pregnancy is near term.   Other conditions make the laparoscopic procedure impossible.  If your surgeon feels it is not safe to continue with a laparoscopic procedure, he or she will perform an open abdominal procedure. In this case, the surgeon will make an incision to open the abdomen. This gives the surgeon a larger view and field to work within. This  may allow the surgeon to perform procedures that sometimes cannot be performed with a laparoscope alone. Open surgery has a longer recovery time. AFTER THE PROCEDURE  You will be taken to the recovery area where a nurse will watch and check your progress.   You may be allowed to go home the  same day.   Do not resume physical activities until directed by your caregiver.   You may resume a normal diet and activities as directed.  Document Released: 02/26/2005 Document Revised: 02/15/2011 Document Reviewed: 08/11/2010 [facility] Patient Information 2012 [place], [place].

## 2011-09-06 NOTE — Progress Notes (Signed)
Patient ID: Kimberly Gonzalez, female   DOB: 1975/04/25, 36 y.o.   MRN: [id number]  Chief Complaint  Patient presents with  . Pre-op Exam    eval abd pain    HPI  Kimberly Gonzalez is a 36 y.o. female.  She is referred by Dr. Chestine Spore at [facility] gastroenterology for consideration of cholecystectomy. Workup to date is nondiagnostic.  This is a healthy 36 year old woman without any major medical problems. She is here with her sister. She is asymptomatic today.  She has a one-year history of intermittent attacks of epigastric pain nausea and vomiting. She says that this will usually be after a fatty meal at night and she will wake up in the middle of the night with epigastric pain which feels like muscle cramps and repeated bouts of nausea and vomiting. First episode was one year ago. She had no further symptoms for 3 months and had another similar episode. She had a third episode in November of last year and went to the emergency room. At that time CT scan was nondiagnostic. There was some fluid around the gallbladder but was also minor ascites around the liver felt to be nonspecific and possibly related to recent volume repletion. Hepatobiliary scan with ejection fraction was normal. Upper endoscopy was normal. She has been on proton pump inhibitors.  Her last serious attack was in May of this year which lasted about 2 days patient has a mild attacks since that time. She had another ultrasound on August 27, 2011 which was negative. Her biggest concern is that this is interfering with her work and she is concerned about that.  Her sister confirms these attacks. Her sister had to have emergent cholecystectomy for acute cholecystitis with stones. There were no other obvious family members with biliary tract disease. HPI  Past Medical History  Diagnosis Date  . Sinus infection     Past Surgical History  Procedure Date  . Lasix     bilateral eyes  . Eye surgery 02/2011    lasix    Family  History  Problem Relation Age of Onset  . Diabetes Father   . Leukemia Father   . Cancer Father     leukemia  . Kidney disease Sister   . Diabetes Paternal Aunt   . Diabetes Paternal Uncle     Social History History  Substance Use Topics  . Smoking status: Never Smoker   . Smokeless tobacco: Never Used  . Alcohol Use: No    Allergies  Allergen Reactions  . Ceclor (Cefaclor) Other (See Comments)    "hives in my joints"  . Codeine Hives  . Erythromycin Hives and Rash    Current Outpatient Prescriptions  Medication Sig Dispense Refill  . Ascorbic Acid (VITAMIN C) 1000 MG tablet Take 1,000 mg by mouth daily.        . fluticasone (FLONASE) 50 MCG/ACT nasal spray Place 2 sprays into the nose daily as needed. Allergies       . ibuprofen (ADVIL,MOTRIN) 200 MG tablet Take 400 mg by mouth every 6 (six) hours as needed. Pain.       Marland Kitchen loratadine (CLARITIN) 10 MG tablet Take 10 mg by mouth daily.        . pantoprazole (PROTONIX) 40 MG tablet Take one tablet by mouth daily 30 minutes prior to breakfast for reflux  30 tablet  3  . Polyethyl Glycol-Propyl Glycol (SYSTANE) 0.4-0.3 % SOLN Apply 1 drop to eye daily as needed. Dry eyes....       Marland Kitchen  sodium chloride (OCEAN) 0.65 % nasal spray Place 1 spray into the nose daily.          Review of Systems Review of Systems  Constitutional: Negative for fever, chills and unexpected weight change.  HENT: Negative for hearing loss, congestion, sore throat, trouble swallowing and voice change.   Eyes: Negative for visual disturbance.  Respiratory: Negative for cough and wheezing.   Cardiovascular: Negative for chest pain, palpitations and leg swelling.  Gastrointestinal: Positive for nausea, vomiting and abdominal pain. Negative for diarrhea, constipation, blood in stool, abdominal distention and anal bleeding.  Genitourinary: Negative for hematuria, vaginal bleeding and difficulty urinating.  Musculoskeletal: Negative for arthralgias.  Skin:  Negative for rash and wound.  Neurological: Negative for seizures, syncope and headaches.  Hematological: Negative for adenopathy. Does not bruise/bleed easily.  Psychiatric/Behavioral: Negative for confusion.    Blood pressure 125/78, pulse 76, temperature 98 F (36.7 C), temperature source Temporal, height 5\' 3"  (1.6 m), weight 149 lb 12.8 oz (67.949 kg), SpO2 99.00%.  Physical Exam Physical Exam  Constitutional: She is oriented to person, place, and time. She appears well-developed and well-nourished. No distress.       Intelligent. Sincere.  Pleasant.  In no distress.  HENT:  Head: Normocephalic and atraumatic.  Nose: Nose normal.  Mouth/Throat: No oropharyngeal exudate.  Eyes: Conjunctivae and EOM are normal. Pupils are equal, round, and reactive to light. Left eye exhibits no discharge. No scleral icterus.  Neck: Neck supple. No JVD present. No tracheal deviation present. No thyromegaly present.  Cardiovascular: Normal rate, regular rhythm, normal heart sounds and intact distal pulses.   No murmur heard. Pulmonary/Chest: Effort normal and breath sounds normal. No respiratory distress. She has no wheezes. She has no rales. She exhibits no tenderness.  Abdominal: Soft. Bowel sounds are normal. She exhibits no distension and no mass. There is no tenderness. There is no rebound and no guarding.  Musculoskeletal: She exhibits no edema and no tenderness.  Lymphadenopathy:    She has no cervical adenopathy.  Neurological: She is alert and oriented to person, place, and time. She exhibits normal muscle tone. Coordination normal.  Skin: Skin is warm. No rash noted. She is not diaphoretic. No erythema. No pallor.  Psychiatric: She has a normal mood and affect. Her behavior is normal. Judgment and thought content normal.    Data Reviewed I have reviewed the records from the emergency department and from [facility]. I have reviewed her endoscopy report and all her imaging  studies.  Assessment    Postprandial abdominal pain nausea vomiting and fatty food intolerance, intermittent. Historically this is consistent with severe biliary colic.  Diagnostic workup to date has been thorough but is nondiagnostic.  Patient is otherwise healthy.  Family history gallbladder disease in sister    Plan    Very lengthy discussion regarding differential diagnosis, probability of relief of symptoms with surgery, which is 50% at best, risks of surgery and risk-benefit ratios.  She was offered elective cholecystectomy, referral to a university center, or observation only and continue proton pump inhibitors. It was her desire to proceed with empiric cholecystectomy.  I discussed the indications, details, techniques and numerous  risks of the surgery with  the patient and her sister. We have emphasized no better than 50% probability relief of symptoms. We discussed probability of risk and complications. She is comfortable with all this. She understands all these issues. Her questions were answered. She agrees with this plan.       Mikey Bussing  Grayce Sessions, M.D., [facility] General and Minimally invasive Surgery Breast and Colorectal Surgery Office:   [phone] Pager:   [phone]  09/06/2011, 10:32 AM

## 2011-09-26 ENCOUNTER — Ambulatory Visit (INDEPENDENT_AMBULATORY_CARE_PROVIDER_SITE_OTHER): Payer: BC Managed Care – PPO | Admitting: General Surgery

## 2011-10-16 ENCOUNTER — Encounter (HOSPITAL_COMMUNITY): Payer: Self-pay | Admitting: Pharmacy Technician

## 2011-10-23 ENCOUNTER — Encounter (HOSPITAL_COMMUNITY): Payer: Self-pay

## 2011-10-23 ENCOUNTER — Encounter (HOSPITAL_COMMUNITY)
Admission: RE | Admit: 2011-10-23 | Discharge: 2011-10-23 | Disposition: A | Discharge status: Home / Self Care | Payer: BC Managed Care – PPO | Source: Ambulatory Visit | Attending: General Surgery | Admitting: General Surgery

## 2011-10-23 HISTORY — DX: Family history of other specified conditions: Z84.89

## 2011-10-23 LAB — COMPREHENSIVE METABOLIC PANEL
ALT: 13 U/L (ref 0–35)
AST: 17 U/L (ref 0–37)
Calcium: 9.4 mg/dL (ref 8.4–10.5)
Creatinine, Ser: 0.64 mg/dL (ref 0.50–1.10)
Sodium: 139 mEq/L (ref 135–145)
Total Protein: 6.7 g/dL (ref 6.0–8.3)

## 2011-10-23 LAB — CBC
MCH: 32.2 pg (ref 26.0–34.0)
MCHC: 35.4 g/dL (ref 30.0–36.0)
MCV: 90.9 fL (ref 78.0–100.0)
Platelets: 251 10*3/uL (ref 150–400)
RDW: 12 % (ref 11.5–15.5)

## 2011-10-23 LAB — URINALYSIS, ROUTINE W REFLEX MICROSCOPIC
Bilirubin Urine: NEGATIVE
Glucose, UA: NEGATIVE mg/dL
Ketones, ur: NEGATIVE mg/dL
Leukocytes, UA: NEGATIVE
Nitrite: NEGATIVE
Specific Gravity, Urine: 1.012 (ref 1.005–1.030)
pH: 7.5 (ref 5.0–8.0)

## 2011-10-23 LAB — HCG, SERUM, QUALITATIVE: Preg, Serum: NEGATIVE

## 2011-10-23 LAB — DIFFERENTIAL
Basophils Absolute: 0 10*3/uL (ref 0.0–0.1)
Eosinophils Relative: 1 % (ref 0–5)
Lymphocytes Relative: 34 % (ref 12–46)
Lymphs Abs: 1.4 10*3/uL (ref 0.7–4.0)
Monocytes Absolute: 0.5 10*3/uL (ref 0.1–1.0)
Neutro Abs: 2.2 10*3/uL (ref 1.7–7.7)

## 2011-10-23 LAB — SURGICAL PCR SCREEN: Staphylococcus aureus: NEGATIVE

## 2011-10-23 NOTE — Patient Instructions (Addendum)
22 Kimberly Gonzalez  10/23/2011   Your procedure is scheduled on:  10/30/11 Tuesday  Surgery [id number]  Report to [facility] at     0730  AM.  Call this number if you have problems the morning of surgery: [phone]     Or PST   [id number]  [facility]   Remember:   Do not eat food: or drink any fluids After Midnight. Monday NIGHT  :     Take these medicines the morning of surgery with A SIP OF WATER:    PROTONIX, CLARITIN,                    Flonase, Ocean spray   Do not wear jewelry, make-up or nail polish.  Do not wear lotions, powders, or perfumes. You may wear deodorant.  Do not shave 48 hours prior to surgery.  Do not bring valuables to the hospital.  Contacts, dentures or bridgework may not be worn into surgery.  Leave suitcase in the car. After surgery it may be brought to your room.  For patients admitted to the hospital, checkout time is 11:00 AM the day of discharge.   Patients discharged the day of surgery will not be allowed to drive home.  Name and phone number of your driver:   mom                                                                   Special Instructions: CHG Shower Use Special Wash: 1/2 bottle night before surgery and 1/2 bottle morning of surgery. REGULAR SOAP FACE AND PRIVATES              LADIES- NO SHAVING 48 HOURS BEFORE USING BETASEPT SOAP.                   Please read over the following fact sheets that you were given: MRSA Information

## 2011-10-23 NOTE — Pre-Procedure Instructions (Signed)
Dr Derrell Lolling-  ANCEF ORDERED PRE OP-  PATIENT STATES [facility] CAUSES "HIVES IN HER JOINTS" as an allergy.  Please clarify   If you want this or something else. Thanks

## 2011-10-29 NOTE — H&P (Signed)
Kimberly Gonzalez     MRN: [id number]   Description: 36 year old female  Provider: Ernestene Mention, MD  Department: [facility]       Diagnoses     Epigastric abdominal pain   - Primary    789.06       Vitals    BP Pulse Temp Ht Wt BMI    125/78 76 98 F (36.7 C) (Temporal) 5\' 3"  (1.6 m) 149 lb 12.8 oz (67.949 kg) 26.54 kg/m2      History and Physical   Ernestene Mention, MD  Patient ID: Kimberly Gonzalez, female   DOB: 1975-03-20, 36 y.o.   MRN: [id number]               HPI   Kimberly Gonzalez is a 36 y.o. female.  She is referred by Dr. Chestine Spore at [facility] gastroenterology for consideration of cholecystectomy. Workup to date is nondiagnostic.   This is a healthy 36 year old woman without any major medical problems. She is here with her sister. She is asymptomatic today.   She has a one-year history of intermittent attacks of epigastric pain nausea and vomiting. She says that this will usually be after a fatty meal at night and she will wake up in the middle of the night with epigastric pain which feels like muscle cramps and repeated bouts of nausea and vomiting. First episode was one year ago. She had no further symptoms for 3 months and had another similar episode. She had a third episode in November of last year and went to the emergency room. At that time CT scan was nondiagnostic. There was some fluid around the gallbladder but was also minor ascites around the liver felt to be nonspecific and possibly related to recent volume repletion. Hepatobiliary scan with ejection fraction was normal. Upper endoscopy was normal. She has been on proton pump inhibitors.   Her last serious attack was in May of this year which lasted about 2 days patient has a mild attacks since that time. She had another ultrasound on August 27, 2011 which was negative. Her biggest concern is that this is interfering with her work and she is concerned about that.   Her sister confirms  these attacks. Her sister had to have emergent cholecystectomy for acute cholecystitis with stones. There were no other obvious family members with biliary tract disease.     Past Medical History   Diagnosis  Date   .  Sinus infection         Past Surgical History   Procedure  Date   .  Lasix         bilateral eyes   .  Eye surgery  02/2011       lasix       Family History   Problem  Relation  Age of Onset   .  Diabetes  Father     .  Leukemia  Father     .  Cancer  Father         leukemia   .  Kidney disease  Sister     .  Diabetes  Paternal Aunt     .  Diabetes  Paternal Uncle        Social History History   Substance Use Topics   .  Smoking status:  Never Smoker    .  Smokeless tobacco:  Never Used   .  Alcohol Use:  No  Allergies   Allergen  Reactions   .  Ceclor (Cefaclor)  Other (See Comments)       "hives in my joints"   .  Codeine  Hives   .  Erythromycin  Hives and Rash       Current Outpatient Prescriptions   Medication  Sig  Dispense  Refill   .  Ascorbic Acid (VITAMIN C) 1000 MG tablet  Take 1,000 mg by mouth daily.           .  fluticasone (FLONASE) 50 MCG/ACT nasal spray  Place 2 sprays into the nose daily as needed. Allergies           .  ibuprofen (ADVIL,MOTRIN) 200 MG tablet  Take 400 mg by mouth every 6 (six) hours as needed. Pain.          Marland Kitchen  loratadine (CLARITIN) 10 MG tablet  Take 10 mg by mouth daily.           .  pantoprazole (PROTONIX) 40 MG tablet  Take one tablet by mouth daily 30 minutes prior to breakfast for reflux   30 tablet   3   .  Polyethyl Glycol-Propyl Glycol (SYSTANE) 0.4-0.3 % SOLN  Apply 1 drop to eye daily as needed. Dry eyes....          .  sodium chloride (OCEAN) 0.65 % nasal spray  Place 1 spray into the nose daily.              Review of Systems  Constitutional: Negative for fever, chills and unexpected weight change.  HENT: Negative for hearing loss, congestion, sore throat, trouble swallowing and voice  change.   Eyes: Negative for visual disturbance.  Respiratory: Negative for cough and wheezing.   Cardiovascular: Negative for chest pain, palpitations and leg swelling.  Gastrointestinal: Positive for nausea, vomiting and abdominal pain. Negative for diarrhea, constipation, blood in stool, abdominal distention and anal bleeding.  Genitourinary: Negative for hematuria, vaginal bleeding and difficulty urinating.  Musculoskeletal: Negative for arthralgias.  Skin: Negative for rash and wound.  Neurological: Negative for seizures, syncope and headaches.  Hematological: Negative for adenopathy. Does not bruise/bleed easily.  Psychiatric/Behavioral: Negative for confusion.    Blood pressure 125/78, pulse 76, temperature 98 F (36.7 C), temperature source Temporal, height 5\' 3"  (1.6 m), weight 149 lb 12.8 oz (67.949 kg), SpO2 99.00%.   Physical Exam   Constitutional: She is oriented to person, place, and time. She appears well-developed and well-nourished. No distress.       Intelligent. Sincere.  Pleasant.  In no distress.  HENT:   Head: Normocephalic and atraumatic.   Nose: Nose normal.   Mouth/Throat: No oropharyngeal exudate.  Eyes: Conjunctivae and EOM are normal. Pupils are equal, round, and reactive to light. Left eye exhibits no discharge. No scleral icterus.  Neck: Neck supple. No JVD present. No tracheal deviation present. No thyromegaly present.  Cardiovascular: Normal rate, regular rhythm, normal heart sounds and intact distal pulses.    No murmur heard. Pulmonary/Chest: Effort normal and breath sounds normal. No respiratory distress. She has no wheezes. She has no rales. She exhibits no tenderness.  Abdominal: Soft. Bowel sounds are normal. She exhibits no distension and no mass. There is no tenderness. There is no rebound and no guarding.  Musculoskeletal: She exhibits no edema and no tenderness.  Lymphadenopathy:    She has no cervical adenopathy.  Neurological: She is alert  and oriented to person, place, and time. She exhibits normal  muscle tone. Coordination normal.  Skin: Skin is warm. No rash noted. She is not diaphoretic. No erythema. No pallor.  Psychiatric: She has a normal mood and affect. Her behavior is normal. Judgment and thought content normal.    Data Reviewed I have reviewed the records from the emergency department and from [facility]. I have reviewed her endoscopy report and all her imaging studies.   Assessment Postprandial abdominal pain nausea vomiting and fatty food intolerance, intermittent. Historically this is consistent with severe biliary colic.   Diagnostic workup to date has been thorough but is nondiagnostic.   Patient is otherwise healthy.   Family history gallbladder disease in sister   Plan Very lengthy discussion regarding differential diagnosis, probability of relief of symptoms with surgery, which is 50% at best, risks of surgery and risk-benefit ratios.   She was offered elective cholecystectomy, [facility] a [facility], or observation only and continue proton pump inhibitors. It was her desire to proceed with empiric cholecystectomy.   I discussed the indications, details, techniques and numerous  risks of the surgery with  the patient and her sister. We have emphasized no better than 50% probability relief of symptoms. We discussed probability of risk and complications. She is comfortable with all this. She understands all these issues. Her questions were answered. She agrees with this plan.       Angelia Mould. Derrell Lolling, M.D., [facility] General and Minimally invasive Surgery Breast and Colorectal Surgery Office:   [phone] Pager:   [phone]

## 2011-10-30 ENCOUNTER — Encounter (HOSPITAL_COMMUNITY): Payer: Self-pay | Admitting: Anesthesiology

## 2011-10-30 ENCOUNTER — Ambulatory Visit (HOSPITAL_COMMUNITY): Payer: BC Managed Care – PPO | Admitting: Anesthesiology

## 2011-10-30 ENCOUNTER — Encounter (HOSPITAL_COMMUNITY)
Admission: RE | Disposition: A | Discharge status: Home / Self Care | Payer: Self-pay | Source: Ambulatory Visit | Attending: General Surgery

## 2011-10-30 ENCOUNTER — Ambulatory Visit (HOSPITAL_COMMUNITY)
Admission: RE | Admit: 2011-10-30 | Discharge: 2011-10-30 | Disposition: A | Discharge status: Home / Self Care | Payer: BC Managed Care – PPO | Source: Ambulatory Visit | Attending: General Surgery | Admitting: General Surgery

## 2011-10-30 ENCOUNTER — Encounter (HOSPITAL_COMMUNITY): Payer: Self-pay

## 2011-10-30 ENCOUNTER — Ambulatory Visit (HOSPITAL_COMMUNITY): Payer: BC Managed Care – PPO

## 2011-10-30 DIAGNOSIS — Z01810 Encounter for preprocedural cardiovascular examination: Secondary | ICD-10-CM | POA: Insufficient documentation

## 2011-10-30 DIAGNOSIS — Z79899 Other long term (current) drug therapy: Secondary | ICD-10-CM | POA: Insufficient documentation

## 2011-10-30 DIAGNOSIS — K811 Chronic cholecystitis: Secondary | ICD-10-CM

## 2011-10-30 DIAGNOSIS — Z01812 Encounter for preprocedural laboratory examination: Secondary | ICD-10-CM | POA: Insufficient documentation

## 2011-10-30 HISTORY — PX: CHOLECYSTECTOMY: SHX55

## 2011-10-30 SURGERY — LAPAROSCOPIC CHOLECYSTECTOMY WITH INTRAOPERATIVE CHOLANGIOGRAM
Anesthesia: General | Site: Abdomen | Wound class: Contaminated

## 2011-10-30 MED ORDER — SODIUM CHLORIDE 0.9 % IV SOLN: 250.0000 mL | INTRAVENOUS | Status: DC | PRN

## 2011-10-30 MED ORDER — HYDROMORPHONE HCL PF 1 MG/ML IJ SOLN
INTRAMUSCULAR | Status: AC
  Filled 2011-10-30: qty 1

## 2011-10-30 MED ORDER — NEOSTIGMINE METHYLSULFATE 1 MG/ML IJ SOLN
INTRAMUSCULAR | Status: DC | PRN
  Administered 2011-10-30: 3.5 mg via INTRAVENOUS

## 2011-10-30 MED ORDER — ACETAMINOPHEN 325 MG PO TABS: 650.0000 mg | ORAL_TABLET | ORAL | Status: DC | PRN

## 2011-10-30 MED ORDER — LACTATED RINGERS IR SOLN
Status: DC | PRN
  Administered 2011-10-30: 1000 mL

## 2011-10-30 MED ORDER — LACTATED RINGERS IV SOLN
INTRAVENOUS | Status: DC | PRN
  Administered 2011-10-30 (×2): via INTRAVENOUS

## 2011-10-30 MED ORDER — BUPIVACAINE-EPINEPHRINE 0.5% -1:200000 IJ SOLN
INTRAMUSCULAR | Status: DC | PRN
  Administered 2011-10-30: 20 mL

## 2011-10-30 MED ORDER — DEXAMETHASONE SODIUM PHOSPHATE 10 MG/ML IJ SOLN
INTRAMUSCULAR | Status: DC | PRN
  Administered 2011-10-30: 10 mg via INTRAVENOUS

## 2011-10-30 MED ORDER — ACETAMINOPHEN 10 MG/ML IV SOLN: 1000.0000 mg | Freq: Once | INTRAVENOUS | Status: DC | PRN

## 2011-10-30 MED ORDER — OXYCODONE HCL 5 MG PO TABS: 5.0000 mg | ORAL_TABLET | Freq: Once | ORAL | Status: DC | PRN

## 2011-10-30 MED ORDER — ROCURONIUM BROMIDE 100 MG/10ML IV SOLN
INTRAVENOUS | Status: DC | PRN
  Administered 2011-10-30: 40 mg via INTRAVENOUS

## 2011-10-30 MED ORDER — GLYCOPYRROLATE 0.2 MG/ML IJ SOLN
INTRAMUSCULAR | Status: DC | PRN
  Administered 2011-10-30: 0.4 mg via INTRAVENOUS

## 2011-10-30 MED ORDER — BUPIVACAINE-EPINEPHRINE (PF) 0.5% -1:200000 IJ SOLN
INTRAMUSCULAR | Status: AC
  Filled 2011-10-30: qty 10

## 2011-10-30 MED ORDER — IOHEXOL 300 MG/ML  SOLN
INTRAMUSCULAR | Status: DC | PRN
  Administered 2011-10-30: 10 mL

## 2011-10-30 MED ORDER — HYDROCODONE-ACETAMINOPHEN 5-325 MG PO TABS: 1.0000 | ORAL_TABLET | ORAL | Status: AC | PRN

## 2011-10-30 MED ORDER — ACETAMINOPHEN 10 MG/ML IV SOLN
INTRAVENOUS | Status: AC
  Filled 2011-10-30: qty 100

## 2011-10-30 MED ORDER — ONDANSETRON HCL 4 MG/2ML IJ SOLN
4.0000 mg | Freq: Four times a day (QID) | INTRAMUSCULAR | Status: DC | PRN
  Administered 2011-10-30: 4 mg via INTRAVENOUS
  Filled 2011-10-30: qty 2

## 2011-10-30 MED ORDER — OXYCODONE HCL 5 MG/5ML PO SOLN: 5.0000 mg | Freq: Once | ORAL | Status: DC | PRN

## 2011-10-30 MED ORDER — MIDAZOLAM HCL 5 MG/5ML IJ SOLN
INTRAMUSCULAR | Status: DC | PRN
  Administered 2011-10-30: 2 mg via INTRAVENOUS

## 2011-10-30 MED ORDER — LACTATED RINGERS IV SOLN: INTRAVENOUS | Status: DC

## 2011-10-30 MED ORDER — ONDANSETRON HCL 4 MG/2ML IJ SOLN
INTRAMUSCULAR | Status: DC | PRN
  Administered 2011-10-30: 4 mg via INTRAVENOUS

## 2011-10-30 MED ORDER — HYDROMORPHONE HCL PF 1 MG/ML IJ SOLN
0.2500 mg | INTRAMUSCULAR | Status: DC | PRN
  Administered 2011-10-30 (×2): 0.25 mg via INTRAVENOUS

## 2011-10-30 MED ORDER — OXYCODONE HCL 5 MG PO TABS: 5.0000 mg | ORAL_TABLET | ORAL | Status: DC | PRN

## 2011-10-30 MED ORDER — IOHEXOL 300 MG/ML  SOLN
INTRAMUSCULAR | Status: AC
  Filled 2011-10-30: qty 1

## 2011-10-30 MED ORDER — CIPROFLOXACIN IN D5W 400 MG/200ML IV SOLN
400.0000 mg | INTRAVENOUS | Status: AC
  Administered 2011-10-30: 400 mg via INTRAVENOUS

## 2011-10-30 MED ORDER — FENTANYL CITRATE 0.05 MG/ML IJ SOLN
INTRAMUSCULAR | Status: DC | PRN
  Administered 2011-10-30 (×2): 50 ug via INTRAVENOUS
  Administered 2011-10-30: 100 ug via INTRAVENOUS
  Administered 2011-10-30: 50 ug via INTRAVENOUS

## 2011-10-30 MED ORDER — MEPERIDINE HCL 50 MG/ML IJ SOLN: 6.2500 mg | INTRAMUSCULAR | Status: DC | PRN

## 2011-10-30 MED ORDER — SODIUM CHLORIDE 0.9 % IV SOLN: INTRAVENOUS | Status: DC

## 2011-10-30 MED ORDER — MORPHINE SULFATE 10 MG/ML IJ SOLN: 2.0000 mg | INTRAMUSCULAR | Status: DC | PRN

## 2011-10-30 MED ORDER — PROMETHAZINE HCL 25 MG/ML IJ SOLN: 6.2500 mg | INTRAMUSCULAR | Status: DC | PRN

## 2011-10-30 MED ORDER — SODIUM CHLORIDE 0.9 % IJ SOLN: 3.0000 mL | INTRAMUSCULAR | Status: DC | PRN

## 2011-10-30 MED ORDER — PROPOFOL 10 MG/ML IV BOLUS
INTRAVENOUS | Status: DC | PRN
  Administered 2011-10-30: 180 mg via INTRAVENOUS

## 2011-10-30 MED ORDER — ACETAMINOPHEN 10 MG/ML IV SOLN
INTRAVENOUS | Status: DC | PRN
  Administered 2011-10-30: 1000 mg via INTRAVENOUS

## 2011-10-30 MED ORDER — SODIUM CHLORIDE 0.9 % IJ SOLN: 3.0000 mL | Freq: Two times a day (BID) | INTRAMUSCULAR | Status: DC

## 2011-10-30 MED ORDER — ACETAMINOPHEN 650 MG RE SUPP
650.0000 mg | RECTAL | Status: DC | PRN
  Filled 2011-10-30: qty 1

## 2011-10-30 MED ORDER — LIDOCAINE HCL (CARDIAC) 20 MG/ML IV SOLN
INTRAVENOUS | Status: DC | PRN
  Administered 2011-10-30: 50 mg via INTRAVENOUS

## 2011-10-30 MED ORDER — CIPROFLOXACIN IN D5W 400 MG/200ML IV SOLN
INTRAVENOUS | Status: AC
  Filled 2011-10-30: qty 200

## 2011-10-30 SURGICAL SUPPLY — 35 items
APPLIER CLIP ROT 10 11.4 M/L (STAPLE) ×2
BENZOIN TINCTURE PRP APPL 2/3 (GAUZE/BANDAGES/DRESSINGS) IMPLANT
CLIP APPLIE ROT 10 11.4 M/L (STAPLE) ×1 IMPLANT
CLOTH BEACON ORANGE TIMEOUT ST (SAFETY) ×2 IMPLANT
COVER MAYO STAND STRL (DRAPES) ×2 IMPLANT
DECANTER SPIKE VIAL GLASS SM (MISCELLANEOUS) ×2 IMPLANT
DERMABOND ADVANCED (GAUZE/BANDAGES/DRESSINGS) ×1
DERMABOND ADVANCED .7 DNX12 (GAUZE/BANDAGES/DRESSINGS) ×1 IMPLANT
DRAPE C-ARM 42X72 X-RAY (DRAPES) ×2 IMPLANT
DRAPE LAPAROSCOPIC ABDOMINAL (DRAPES) ×2 IMPLANT
ELECT REM PT RETURN 9FT ADLT (ELECTROSURGICAL) ×2
ELECTRODE REM PT RTRN 9FT ADLT (ELECTROSURGICAL) ×1 IMPLANT
GLOVE BIOGEL PI IND STRL 7.0 (GLOVE) ×1 IMPLANT
GLOVE BIOGEL PI INDICATOR 7.0 (GLOVE) ×1
GLOVE EUDERMIC 7 POWDERFREE (GLOVE) ×2 IMPLANT
GOWN STRL NON-REIN LRG LVL3 (GOWN DISPOSABLE) ×4 IMPLANT
GOWN STRL REIN XL XLG (GOWN DISPOSABLE) ×2 IMPLANT
HEMOSTAT SNOW SURGICEL 2X4 (HEMOSTASIS) IMPLANT
IV LACTATED RINGERS 1000ML (IV SOLUTION) ×2 IMPLANT
KIT BASIN OR (CUSTOM PROCEDURE TRAY) ×2 IMPLANT
MANIFOLD NEPTUNE II (INSTRUMENTS) ×2 IMPLANT
NS IRRIG 1000ML POUR BTL (IV SOLUTION) ×2 IMPLANT
POUCH SPECIMEN RETRIEVAL 10MM (ENDOMECHANICALS) IMPLANT
SCISSORS LAP 5X35 DISP (ENDOMECHANICALS) ×2 IMPLANT
SET CHOLANGIOGRAPH MIX (MISCELLANEOUS) ×2 IMPLANT
SET IRRIG TUBING LAPAROSCOPIC (IRRIGATION / IRRIGATOR) ×2 IMPLANT
SOLUTION ANTI FOG 6CC (MISCELLANEOUS) ×2 IMPLANT
STRIP CLOSURE SKIN 1/2X4 (GAUZE/BANDAGES/DRESSINGS) IMPLANT
SUT MNCRL AB 4-0 PS2 18 (SUTURE) ×2 IMPLANT
TOWEL OR 17X26 10 PK STRL BLUE (TOWEL DISPOSABLE) ×4 IMPLANT
TRAY LAP CHOLE (CUSTOM PROCEDURE TRAY) ×2 IMPLANT
TROCAR BLADELESS OPT 5 75 (ENDOMECHANICALS) IMPLANT
TROCAR XCEL BLUNT TIP 100MML (ENDOMECHANICALS) ×2 IMPLANT
TROCAR XCEL NON-BLD 11X100MML (ENDOMECHANICALS) IMPLANT
TUBING INSUFFLATION 10FT LAP (TUBING) ×2 IMPLANT

## 2011-10-30 NOTE — Progress Notes (Signed)
Call from Dr Derrell Lolling. Pharmacy has alerted him not to use oxycodone IR due to hives. Verified with pt she is able to use vicodin/hydrocodone. Pt states she used it within the last year without any adverse side effects. Rx for home use given to family for vicodin.

## 2011-10-30 NOTE — Anesthesia Preprocedure Evaluation (Addendum)
Anesthesia Evaluation  Patient identified by MRN, date of birth, ID band Patient awake    Reviewed: Allergy & Precautions, H&P , NPO status , Patient's Chart, lab work & pertinent test results  History of Anesthesia Complications (+) Family history of anesthesia reaction  Airway Mallampati: II TM Distance: >3 FB Neck ROM: Full    Dental  (+) Teeth Intact and Dental Advisory Given   Pulmonary  breath sounds clear to auscultation  Pulmonary exam normal       Cardiovascular Exercise Tolerance: Good Rhythm:Regular Rate:Normal     Neuro/Psych  Headaches,    GI/Hepatic Neg liver ROS, hiatal hernia,   Endo/Other  negative endocrine ROS  Renal/GU negative Renal ROS     Musculoskeletal   Abdominal   Peds  Hematology negative hematology ROS (+)   Anesthesia Other Findings   Reproductive/Obstetrics                        Anesthesia Physical Anesthesia Plan  ASA: II  Anesthesia Plan: General   Post-op Pain Management:    Induction: Intravenous  Airway Management Planned: Oral ETT  Additional Equipment:   Intra-op Plan:   Post-operative Plan: Extubation in OR  Informed Consent: I have reviewed the patients History and Physical, chart, labs and discussed the procedure including the risks, benefits and alternatives for the proposed anesthesia with the patient or authorized representative who has indicated his/her understanding and acceptance.   Dental advisory given  Plan Discussed with: CRNA and Surgeon  Anesthesia Plan Comments:         Anesthesia Quick Evaluation

## 2011-10-30 NOTE — Interval H&P Note (Signed)
History and Physical Interval Note:  10/30/2011 8:42 AM  Kimberly Gonzalez  has presented today for surgery, with the diagnosis of chronic cholecystitis  The goals and the various methods of treatment have been discussed with the patient and family. After consideration of risks, benefits and other options for treatment, the patient has consented to  Procedure(s) (LRB): LAPAROSCOPIC CHOLECYSTECTOMY WITH INTRAOPERATIVE CHOLANGIOGRAM (N/A) as a surgical intervention .  The patient's history has been reviewed, patient examined, no change in status, stable for surgery.  I have reviewed the patient's chart and labs.  Questions were answered to the patient's satisfaction.     Ernestene Mention

## 2011-10-30 NOTE — Discharge Instructions (Signed)
Drink lots of fluids and follow a low-fat diet.  He may shower starting tomorrow.  He may start driving a car in 2 days.  No sports or heavy lifting for 2 weeks.      [facility] LAPAROSCOPIC SURGERY: POST OP INSTRUCTIONS Always review your discharge instruction sheet given to you by the facility where your surgery was performed. IF YOU HAVE DISABILITY OR FAMILY LEAVE FORMS, YOU MUST BRING THEM TO THE OFFICE FOR PROCESSING.   DO NOT GIVE THEM TO YOUR DOCTOR.  1. A prescription for pain medication may be given to you upon discharge.  Take your pain medication as prescribed, if needed.  If narcotic pain medicine is not needed, then you may take acetaminophen (Tylenol) or ibuprofen (Advil) as needed. 2. Take your usually prescribed medications unless otherwise directed. 3. If you need a refill on your pain medication, please contact your pharmacy.  They will contact our office to request authorization. Prescriptions will not be filled after 5pm or on week-ends. 4. You should follow a light diet the first few days after arrival home, such as soup and crackers, etc.  Be sure to include lots of fluids daily. 5. Most patients will experience some swelling and bruising in the area of the incisions.  Ice packs will help.  Swelling and bruising can take several days to resolve.  6. It is common to experience some constipation if taking pain medication after surgery.  Increasing fluid intake and taking a stool softener (such as Colace) will usually help or prevent this problem from occurring.  A mild laxative (Milk of Magnesia or Miralax) should be taken according to package instructions if there are no bowel movements after 48 hours. 7. Unless discharge instructions indicate otherwise, you may remove your bandages 24-48 hours after surgery, and you may shower at that time.  You may have steri-strips (small skin tapes) in place directly over the incision.  These strips  should be left on the skin for 7-10 days.  If your surgeon used skin glue on the incision, you may shower in 24 hours.  The glue will flake off over the next 2-3 weeks.  Any sutures or staples will be removed at the office during your follow-up visit. 8. ACTIVITIES:  You may resume regular (light) daily activities beginning the next day--such as daily self-care, walking, climbing stairs--gradually increasing activities as tolerated.  You may have sexual intercourse when it is comfortable.  Refrain from any heavy lifting or straining until approved by your doctor. a. You may drive when you are no longer taking prescription pain medication, you can comfortably wear a seatbelt, and you can safely maneuver your car and apply brakes. b. RETURN TO WORK:  __________________________________________________________ 9. You should see your doctor in the office for a follow-up appointment approximately 2-3 weeks after your surgery.  Make sure that you call for this appointment within a day or two after you arrive home to insure a convenient appointment time. 10. OTHER INSTRUCTIONS: __________________________________________________________________________________________________________________________ __________________________________________________________________________________________________________________________ WHEN TO CALL YOUR DOCTOR: 1. Fever over 101.0 2. Inability to urinate 3. Continued bleeding from incision. 4. Increased pain, redness, or drainage from the incision. 5. Increasing abdominal pain  The clinic staff is available to answer your questions during regular business hours.  Please dont hesitate to call and ask to speak to one of the nurses for clinical concerns.  If you have a medical emergency, go to the nearest emergency room or call 911.  A surgeon from  [facility] is always on call at the hospital. [address], [place], [place]  [address] ? [address]  [address], [place], [place]   [address] [phone] ? [phone] ? FAX [phone] Web site: [email or web address]

## 2011-10-30 NOTE — Transfer of Care (Signed)
Immediate Anesthesia Transfer of Care Note  Patient: Kimberly Gonzalez  Procedure(s) Performed: Procedure(s) (LRB): LAPAROSCOPIC CHOLECYSTECTOMY WITH INTRAOPERATIVE CHOLANGIOGRAM (N/A)  Patient Location: PACU  Anesthesia Type: General  Level of Consciousness: awake, alert  and patient cooperative  Airway & Oxygen Therapy: Patient Spontanous Breathing and Patient connected to face mask oxygen  Post-op Assessment: Report given to PACU RN and Post -op Vital signs reviewed and stable  Post vital signs: Reviewed and stable  Complications: No apparent anesthesia complications

## 2011-10-30 NOTE — Progress Notes (Signed)
Pt sitting on side of bed drinking gingerale  without difficulty.

## 2011-10-30 NOTE — Progress Notes (Signed)
Pt's nausea is mild and does not feel she needs to vomit.  She is sipping on gingerale and has a cold cloth for comfort.  She states the nausea is intermittent and is tolerable. Pt states she is ready to go home.

## 2011-10-30 NOTE — Anesthesia Postprocedure Evaluation (Signed)
Anesthesia Post Note  Patient: Kimberly Gonzalez  Procedure(s) Performed: Procedure(s) (LRB): LAPAROSCOPIC CHOLECYSTECTOMY WITH INTRAOPERATIVE CHOLANGIOGRAM (N/A)  Anesthesia type: General  Patient location: PACU  Post pain: Pain level controlled  Post assessment: Post-op Vital signs reviewed  Last Vitals: BP 99/48  Pulse 66  Temp 36.8 C  Resp 16  SpO2 100%  Post vital signs: Reviewed  Level of consciousness: sedated  Complications: No apparent anesthesia complications

## 2011-10-30 NOTE — Progress Notes (Signed)
Pt sat on side of bed without any complaints then pt ambulated to bathroom with standby assist.  Pt tolerated well. Pt voided moderate amount clear yellow urine.

## 2011-10-30 NOTE — Op Note (Signed)
Patient Name:           Kimberly Gonzalez   Date of Surgery:        10/30/2011  Pre op Diagnosis:      Chronic cholecystitis  Post op Diagnosis:    Chronic cholecystitis  Procedure:                 Laparoscopic cholecystectomy with cholangiogram  Surgeon:                     Angelia Mould. Derrell Lolling, M.D., FACS  Assistant:                      Harriette Bouillon, M.D., FACS  Operative Indications:   Kimberly Gonzalez is a 36 y.o. female. She was referred by Dr. Chestine Spore at [facility] gastroenterology for consideration of cholecystectomy. Workup to date is nondiagnostic.  This is a healthy 36 year old woman without any major medical problems.  She has a one-year history of intermittent attacks of epigastric pain nausea and vomiting. She says that this will usually be after a fatty meal at night and she will wake up in the middle of the night with epigastric pain which feels like muscle cramps and repeated bouts of nausea and vomiting. First episode was one year ago. She had no further symptoms for 3 months and had another similar episode. She had a third episode in November of last year and went to the emergency room. At that time CT scan was nondiagnostic. There was some fluid around the gallbladder but was also minor ascites around the liver felt to be nonspecific and possibly related to recent volume repletion. Hepatobiliary scan with ejection fraction was normal. Upper endoscopy was normal. She has been on proton pump inhibitors.  Her last serious attack was in May of this year which lasted about 2 days patient has a mild attacks since that time. She had another ultrasound on August 27, 2011 which was negative. Her biggest concern is that this is interfering with her work and she is concerned about that.   Her sister confirms these attacks. Her sister had to have emergent cholecystectomy for acute cholecystitis with stones. There were no other obvious family members with biliary tract disease   Operative  Findings:       The gallbladder appeared chronically inflamed. There were extensive adhesions up to the infundibulum, body and fundus of the gallbladder. These were soft and chronic.. The gallbladder was thin-walled. There were no obvious stones contained within the gallbladder. The anatomy of the cystic duct, cystic artery and common bile duct were conventional. The cholangiogram showed normal intrahepatic and extrahepatic biliary anatomy, no filling defect, and  No obstruction with good flow of contrast into the duodenum. The liver, stomach, duodenum, colon, and small bowel otherwise looked grossly normal to inspection.  Procedure in Detail:          Following the induction of general endotracheal anesthesia the patient's abdomen was prepped and draped in a sterile fashion. Intravenous antibiotics were given. Surgical time out was performed. 0.5% Marcaine with epinephrine was used as a local infiltration anesthetic. A vertical incision was made in the lower rim of the umbilicus. The fascia was incised in the midline and the abdominal cavity entered under direct vision. An 11 mm Hassan trocar was inserted and secured with a pursestring suture of 0 Vicryl. Pneumoperitoneum was created. Camera was inserted documenting the findings as described above. An 11 mm trocar  was placed in the subxiphoid region and two 5 mm trocars placed in the right upper quadrant. We did identify and grasp the fundus of the gallbladder. We spent some time taking down the extensive but soft chronic adhesions to the gallbladder. We had one small  capsular tear from the liver just to the left of the gallbladder which was easily cauterized with no evidence of bleeding or bile leak afterwards. We identified and dissected out the infundibulum the gallbladder. We dissected out the cystic artery and the cystic duct. We isolated the cystic artery as it went onto the wall of the gallbladder, secured it with multiple metal clips and divided. We was  able to create a large window behind the cystic duct. A cholangiogram catheter was inserted into the cystic duct and a cholangiogram obtained using the C-arm. The cholangiogram was normal with findings as described above. The cholangiogram catheter was removed, the cystic duct was secured with multiple endoclips and divided. The gallbladder was dissected from its bed with electrocautery placed in a specimen bag and removed. We did spill some clear bile from the gallbladder but that was evacuated. After removal of the gallbladder we thoroughly irrigated the subhepatic space and subphrenic spaces until the irrigation fluid was completely clear.   There is no bleeding or bile leak at all. The trocars were removed. There was no bleeding from the trocar sites. Pneumoperitoneum was released. The fascia at the umbilicus was closed with 0 Vicryl sutures and skin incisions closed with subcuticular sutures of 4-0 Monocryl and Dermabond. The patient tolerated the procedure well and was taken to the recovery room in stable condition. EBL 10 cc. Counts correct. Complications none.     Angelia Mould. Derrell Lolling, M.D., FACS General and Minimally Invasive Surgery Breast and Colorectal Surgery  10/30/2011 10:33 AM

## 2011-10-31 ENCOUNTER — Encounter (HOSPITAL_COMMUNITY): Payer: Self-pay | Admitting: General Surgery

## 2011-11-13 ENCOUNTER — Telehealth (INDEPENDENT_AMBULATORY_CARE_PROVIDER_SITE_OTHER): Payer: Self-pay

## 2011-11-13 NOTE — Telephone Encounter (Signed)
Pt called stating she has area at upper right quad wd that glue is hanging loose and holding moisture. Pt advise she can trim the glue or gently remove glue and clean wound. Pt to keep area dry and if wd becomes red, swollen,feverish or painful to call office to have are checked. Pt states she understands.

## 2011-11-22 ENCOUNTER — Encounter (INDEPENDENT_AMBULATORY_CARE_PROVIDER_SITE_OTHER): Payer: Self-pay | Admitting: General Surgery

## 2011-11-22 ENCOUNTER — Ambulatory Visit (INDEPENDENT_AMBULATORY_CARE_PROVIDER_SITE_OTHER): Payer: BC Managed Care – PPO | Admitting: General Surgery

## 2011-11-22 VITALS — BP 100/64 | HR 72 | Temp 97.8°F | Resp 16 | Ht 63.0 in | Wt 150.0 lb

## 2011-11-22 DIAGNOSIS — K811 Chronic cholecystitis: Secondary | ICD-10-CM

## 2011-11-22 NOTE — Progress Notes (Signed)
Patient ID: Kimberly Gonzalez, female   DOB: 10/29/1975, 36 y.o.   MRN: [id number]  History: This patient underwent laparoscopic cholecystectomy with cholangiogram on 10/30/2011 because of typical biliary tract symptoms but a negative preop workup. The gallbladder was grossly abnormal with extensive adhesions. The cholangiogram was normal. The final pathology report shows chronic cholecystitis, but no stones. Symptomatically she is much better. She has stopped taking Protonix. She is eating better without symptoms. No bloating or nausea. She is back to work full time.  Exam: Patient looks well. No distress. Abdomen soft. Flat. Nondistended. Nontender. Wounds looked fine.  Assessment: Chronic cholecystitis. Early resolution of symptoms is encouraging, and hopefully this will be sustained  Plan: Resume normal activities without restriction Low-fat diet Okay to discontinue Protonix Return to see me when necessary    Angelia Mould. Derrell Lolling, M.D., [facility] General and Minimally invasive Surgery Breast and Colorectal Surgery Office:   [phone] Pager:   [phone]

## 2011-11-22 NOTE — Patient Instructions (Signed)
Your abdominal incisions have healed uneventfully without any obvious surgical complication. Your final pathology report shows chronic cholecystitis, and you have been given a copy of that.  It is encouraging that your abdominal symptoms have resolved.  I see no reason to continue the protonix. I would recommend a low-fat diet.  Return to see Dr. Derrell Lolling if further problems arise.

## 2012-07-18 IMAGING — NM NM HEPATO W/GB/PHARM/[PERSON_NAME]
3 series · 13 of 13 positions shown · non-contrast
Comparison: CT dated 01/20/2011

CLINICAL DATA: Abdominal pain and nausea.  Intolerance to fatty
foods.

NUCLEAR MEDICINE HEPATOBILIARY WITH GB, PHARM AND QUAN MEASURE
Radiopharmaceutical: 5 mCi technetium 99m Choletec

[he hepatobiliary · 3.43mm/px · 6 of 48 frames shown (1 of 3)]
[frame 5/48]
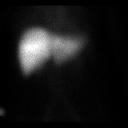
[frame 13/48]
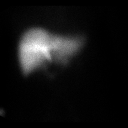
[frame 21/48]
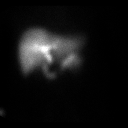
[frame 29/48]
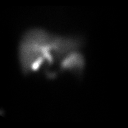
[frame 37/48]
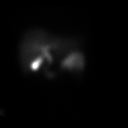
[frame 45/48]
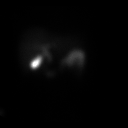

[he hepatobiliary · 3.43mm/px · 6 of 30 frames shown (2 of 3)]
[frame 3/30]
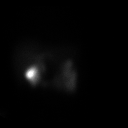
[frame 8/30]
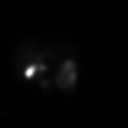
[frame 13/30]
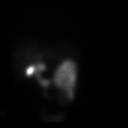
[frame 18/30]
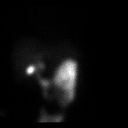
[frame 23/30]
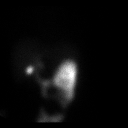
[frame 28/30]
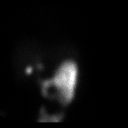

[he hepatobiliary · 1 of 1 slices shown (3 of 3)]
[im 1/1]
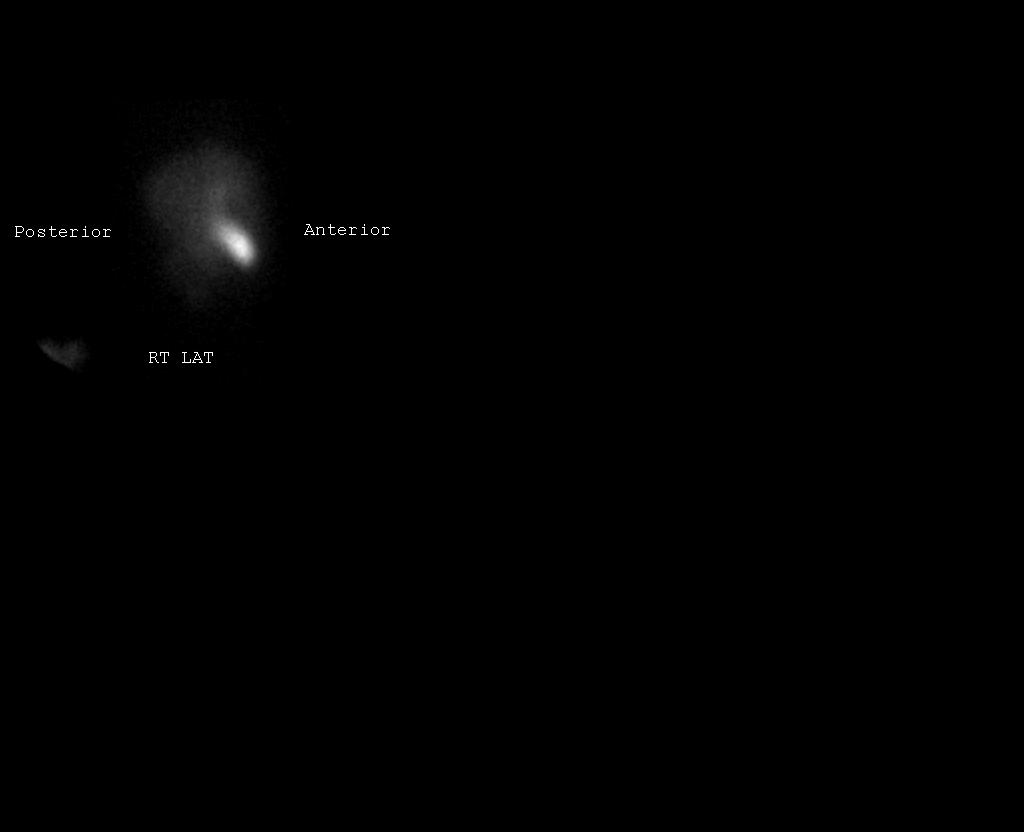

[13 of 13 positions shown; findings below may reference images not displayed]

FINDINGS: The radiopharmaceutical was taken up by the liver and
excretion into the biliary system, including the gallbladder.
Activity is identified in the small bowel.  Gallbladder activity is
confidently identified at 30 minutes.  The calculated gallbladder
ejection fraction is 87%.  Normal gallbladder ejection fraction is
greater than or equal to 30%.
IMPRESSION: Normal hepatobiliary examination.  Normal gallbladder ejection
fraction.

## 2014-02-09 DIAGNOSIS — J342 Deviated nasal septum: Secondary | ICD-10-CM

## 2014-02-09 DIAGNOSIS — J343 Hypertrophy of nasal turbinates: Secondary | ICD-10-CM

## 2014-02-09 HISTORY — DX: Deviated nasal septum: J34.2

## 2014-02-09 HISTORY — DX: Hypertrophy of nasal turbinates: J34.3

## 2014-03-09 ENCOUNTER — Encounter (HOSPITAL_BASED_OUTPATIENT_CLINIC_OR_DEPARTMENT_OTHER): Payer: Self-pay | Admitting: *Deleted

## 2014-03-10 ENCOUNTER — Other Ambulatory Visit: Payer: Self-pay | Admitting: Otolaryngology

## 2014-03-10 NOTE — H&P (Signed)
Kimberly Gonzalez,  Kimberly Gonzalez 38 y.o., female [id number]     Chief Complaint: nasal obstruction, recurrent sinusitis  HPI: 6 week return visit.  She finds  Singulair seems better than Zyrtec.  She tolerated the clindamycin.  Lately, she seems to have increased postnasal drainage but on specific questioning, this may be just pharyngeal phlegm.  She is using Flonase daily.  She has increased her nasal hygiene measures.  In the past, things have typically been worse during the winter.  She is not having any further headaches.  6 week return visit for this 38 year old white female.  She continues on Singulair and Flonase and some nasal hygiene measures.  Typically, things have been worse in the past during the winter months.   For the past 5-6 days, she is feeling poorly including postnasal drainage, low-grade fever (100F), night sweats, slight hoarseness and cough.  No wheezing.  No colorful drainage or productive cough.  Preoperative visit before septoplasty and reduction of turbinates.  She has nasal obstruction, recurrent sinus infections, and possibly RIGHT contact point headaches.   I discussed the surgery in detail including risks and complications.  Questions were answered and informed consent was obtained.  Prescriptions for clindamycin and hydrocodone were written and given.  I discussed nasal hygiene, and advancement of diet and activity postop.  I will see her here one day postop for pack removal, and 10 days postop for septal splint removal.  PMH: Past Medical History  Diagnosis Date  . PONV (postoperative nausea and vomiting)     nausea  . Family history of anesthesia complication     pt's mother and sister have hx. of post-op N/V  . 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 Nasal turbinate hypertrophy 02/2014    Surg Hx: Past Surgical History  Procedure Laterality Date  . Cholecystectomy  10/30/2011    Procedure: LAPAROSCOPIC CHOLECYSTECTOMY WITH INTRAOPERATIVE CHOLANGIOGRAM;  Surgeon:  Ernestene MentionHaywood M Ingram, MD;  Location: [facility];  Service: General;  Laterality: N/A;  . Lasik Bilateral 02/2001    FHx:   Family History  Problem Relation Age of Onset  . Diabetes Father   . Leukemia Father   . Cancer Father     leukemia  . Kidney disease Sister   . Diabetes Paternal Aunt   . Diabetes Paternal Uncle    SocHx:  reports that she has never smoked. She has never used smokeless tobacco. She reports that she does not drink alcohol or use illicit drugs.  ALLERGIES:  Allergies  Allergen Reactions  . Ceclor [Cefaclor] Hives  . Codeine Hives  . Erythromycin Hives  . Pseudoephedrine Other (See Comments)    HEADACHE, BLURRED VISION, RAPID HEART RATE     (Not in a hospital admission)  No results found for this or any previous visit (from the past 48 hour(s)). No results found.  Last menstrual period 02/16/2014.   BP:115/70,  Height: 5 ft 3 in, Weight: 156 lb , BMI: 27.6 kg/m2,   PHYSICAL EXAM: She is trim and healthy.  The septum is deviated to the RIGHT with a prominent chondroethmoid spur.  Pharynx is clear.  Neck unremarkable.   Lungs: Clear to auscultation Heart: Regular rate and rhythm without murmurs Abdomen: Soft, active Extremities: Normal configuration Neurologic: Symmetric, grossly intact.  Studies Reviewed:  Noncontrast CT scan of the paranasal sinuses shows a substantial rightward septal deviation with a prominent chondroethmoid spur, but otherwise completely clear sinuses.  Osteomeatal complex is patent on both sides.    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 Hypertrophy of nasal turbinates (478.0) (J34.3).  We are going to straighten your nasal septum and reduce the turbinates.  I am leaving her prescriptions for clindamycin, and hydrocodone for pain.  I will see you back here one and 10 days after surgery for removal of packs and splints respectively.  See our nasal hygiene instructions  Clindamycin HCl - 150 MG Oral Capsule;1 po qid;  Qty40; R0; Rx. Hydrocodone-Acetaminophen 5-325 MG Oral Tablet;1-2 po q4-6h prn pain; Qty40; R0; Rx  Flo ShanksWOLICKI, Tylisa Alcivar 03/10/2014, 8:39 AM

## 2014-03-15 ENCOUNTER — Ambulatory Visit (HOSPITAL_BASED_OUTPATIENT_CLINIC_OR_DEPARTMENT_OTHER): Payer: BLUE CROSS/BLUE SHIELD | Admitting: Certified Registered"

## 2014-03-15 ENCOUNTER — Encounter (HOSPITAL_BASED_OUTPATIENT_CLINIC_OR_DEPARTMENT_OTHER): Payer: Self-pay | Admitting: *Deleted

## 2014-03-15 ENCOUNTER — Encounter (HOSPITAL_BASED_OUTPATIENT_CLINIC_OR_DEPARTMENT_OTHER)
Admission: RE | Disposition: A | Discharge status: Home / Self Care | Payer: Self-pay | Source: Ambulatory Visit | Attending: Otolaryngology

## 2014-03-15 ENCOUNTER — Ambulatory Visit (HOSPITAL_BASED_OUTPATIENT_CLINIC_OR_DEPARTMENT_OTHER)
Admission: RE | Admit: 2014-03-15 | Discharge: 2014-03-15 | Disposition: A | Discharge status: Home / Self Care | Payer: BLUE CROSS/BLUE SHIELD | Source: Ambulatory Visit | Attending: Otolaryngology | Admitting: Otolaryngology

## 2014-03-15 DIAGNOSIS — J343 Hypertrophy of nasal turbinates: Secondary | ICD-10-CM | POA: Insufficient documentation

## 2014-03-15 DIAGNOSIS — J342 Deviated nasal septum: Secondary | ICD-10-CM | POA: Diagnosis present

## 2014-03-15 HISTORY — PX: NASAL SEPTOPLASTY W/ TURBINOPLASTY: SHX2070

## 2014-03-15 HISTORY — DX: Nausea with vomiting, unspecified: R11.2

## 2014-03-15 HISTORY — DX: Hypertrophy of nasal turbinates: J34.3

## 2014-03-15 HISTORY — DX: Deviated nasal septum: J34.2

## 2014-03-15 HISTORY — DX: Other specified postprocedural states: Z98.890

## 2014-03-15 LAB — POCT HEMOGLOBIN-HEMACUE: Hemoglobin: 14.5 g/dL (ref 12.0–15.0)

## 2014-03-15 SURGERY — SEPTOPLASTY, NOSE, WITH NASAL TURBINATE REDUCTION
Anesthesia: General | Site: Nose | Laterality: Bilateral

## 2014-03-15 MED ORDER — SUCCINYLCHOLINE CHLORIDE 20 MG/ML IJ SOLN
INTRAMUSCULAR | Status: AC
  Filled 2014-03-15: qty 1

## 2014-03-15 MED ORDER — DEXTROSE-NACL 5-0.45 % IV SOLN: INTRAVENOUS | Status: DC

## 2014-03-15 MED ORDER — LIDOCAINE-EPINEPHRINE 1 %-1:100000 IJ SOLN
INTRAMUSCULAR | Status: DC | PRN
Start: 2014-03-15 — End: 2014-03-15
  Administered 2014-03-15: 14 mL

## 2014-03-15 MED ORDER — HYDROMORPHONE HCL 1 MG/ML IJ SOLN
0.2500 mg | INTRAMUSCULAR | Status: DC | PRN
  Administered 2014-03-15: 0.5 mg via INTRAVENOUS

## 2014-03-15 MED ORDER — MIDAZOLAM HCL 2 MG/2ML IJ SOLN
INTRAMUSCULAR | Status: AC
  Filled 2014-03-15: qty 2

## 2014-03-15 MED ORDER — SCOPOLAMINE 1 MG/3DAYS TD PT72: 1.0000 | MEDICATED_PATCH | TRANSDERMAL | Status: DC

## 2014-03-15 MED ORDER — FENTANYL CITRATE 0.05 MG/ML IJ SOLN
INTRAMUSCULAR | Status: DC | PRN
  Administered 2014-03-15: 50 ug via INTRAVENOUS

## 2014-03-15 MED ORDER — SUCCINYLCHOLINE CHLORIDE 20 MG/ML IJ SOLN
INTRAMUSCULAR | Status: DC | PRN
  Administered 2014-03-15: 100 mg via INTRAVENOUS

## 2014-03-15 MED ORDER — MORPHINE SULFATE 2 MG/ML IJ SOLN: 1.0000 mg | INTRAMUSCULAR | Status: DC | PRN

## 2014-03-15 MED ORDER — LACTATED RINGERS IV SOLN
INTRAVENOUS | Status: DC
  Administered 2014-03-15 (×2): via INTRAVENOUS

## 2014-03-15 MED ORDER — PROMETHAZINE HCL 25 MG/ML IJ SOLN: 6.2500 mg | INTRAMUSCULAR | Status: DC | PRN

## 2014-03-15 MED ORDER — MIDAZOLAM HCL 2 MG/ML PO SYRP: 12.0000 mg | ORAL_SOLUTION | Freq: Once | ORAL | Status: DC | PRN

## 2014-03-15 MED ORDER — OXYMETAZOLINE HCL 0.05 % NA SOLN
NASAL | Status: DC | PRN
  Administered 2014-03-15: 1 via NASAL

## 2014-03-15 MED ORDER — MIDAZOLAM HCL 2 MG/2ML IJ SOLN: 1.0000 mg | INTRAMUSCULAR | Status: DC | PRN

## 2014-03-15 MED ORDER — FENTANYL CITRATE 0.05 MG/ML IJ SOLN: 50.0000 ug | INTRAMUSCULAR | Status: DC | PRN

## 2014-03-15 MED ORDER — ONDANSETRON HCL 4 MG/2ML IJ SOLN
INTRAMUSCULAR | Status: DC | PRN
  Administered 2014-03-15: 4 mg via INTRAVENOUS

## 2014-03-15 MED ORDER — HYDROMORPHONE HCL 1 MG/ML IJ SOLN
INTRAMUSCULAR | Status: AC
  Filled 2014-03-15: qty 1

## 2014-03-15 MED ORDER — LIDOCAINE-EPINEPHRINE 1 %-1:100000 IJ SOLN
INTRAMUSCULAR | Status: AC
  Filled 2014-03-15: qty 1

## 2014-03-15 MED ORDER — ONDANSETRON HCL 4 MG PO TABS: 4.0000 mg | ORAL_TABLET | ORAL | Status: DC | PRN

## 2014-03-15 MED ORDER — PROPOFOL 10 MG/ML IV EMUL
INTRAVENOUS | Status: AC
  Filled 2014-03-15: qty 50

## 2014-03-15 MED ORDER — HYDROCODONE-ACETAMINOPHEN 5-325 MG PO TABS
ORAL_TABLET | ORAL | Status: AC
  Filled 2014-03-15: qty 1

## 2014-03-15 MED ORDER — CLINDAMYCIN PHOSPHATE 600 MG/50ML IV SOLN
600.0000 mg | Freq: Once | INTRAVENOUS | Status: AC
  Administered 2014-03-15: 600 mg via INTRAVENOUS

## 2014-03-15 MED ORDER — ONDANSETRON HCL 4 MG/2ML IJ SOLN: 4.0000 mg | INTRAMUSCULAR | Status: DC | PRN

## 2014-03-15 MED ORDER — LIDOCAINE HCL (CARDIAC) 20 MG/ML IV SOLN
INTRAVENOUS | Status: DC | PRN
  Administered 2014-03-15: 60 mg via INTRAVENOUS

## 2014-03-15 MED ORDER — BACITRACIN-NEOMYCIN-POLYMYXIN 400-5-5000 EX OINT
TOPICAL_OINTMENT | CUTANEOUS | Status: AC
  Filled 2014-03-15: qty 1

## 2014-03-15 MED ORDER — CLINDAMYCIN PHOSPHATE 600 MG/50ML IV SOLN
INTRAVENOUS | Status: AC
  Filled 2014-03-15: qty 50

## 2014-03-15 MED ORDER — DEXAMETHASONE SODIUM PHOSPHATE 4 MG/ML IJ SOLN
INTRAMUSCULAR | Status: DC | PRN
  Administered 2014-03-15: 10 mg via INTRAVENOUS

## 2014-03-15 MED ORDER — FENTANYL CITRATE 0.05 MG/ML IJ SOLN
INTRAMUSCULAR | Status: AC
  Filled 2014-03-15: qty 4

## 2014-03-15 MED ORDER — OXYMETAZOLINE HCL 0.05 % NA SOLN
NASAL | Status: AC
  Filled 2014-03-15: qty 15

## 2014-03-15 MED ORDER — SCOPOLAMINE 1 MG/3DAYS TD PT72
1.0000 | MEDICATED_PATCH | Freq: Once | TRANSDERMAL | Status: DC
  Administered 2014-03-15: 1.5 mg via TRANSDERMAL

## 2014-03-15 MED ORDER — OXYMETAZOLINE HCL 0.05 % NA SOLN
2.0000 | NASAL | Status: AC
  Administered 2014-03-15 (×2): 2 via NASAL

## 2014-03-15 MED ORDER — SCOPOLAMINE 1 MG/3DAYS TD PT72
MEDICATED_PATCH | TRANSDERMAL | Status: AC
  Filled 2014-03-15: qty 1

## 2014-03-15 MED ORDER — MIDAZOLAM HCL 5 MG/5ML IJ SOLN
INTRAMUSCULAR | Status: DC | PRN
  Administered 2014-03-15: 2 mg via INTRAVENOUS

## 2014-03-15 MED ORDER — BACITRACIN-NEOMYCIN-POLYMYXIN OINTMENT TUBE
TOPICAL_OINTMENT | CUTANEOUS | Status: DC | PRN
  Administered 2014-03-15: 1 via TOPICAL

## 2014-03-15 MED ORDER — HYDROCODONE-ACETAMINOPHEN 5-325 MG PO TABS
1.0000 | ORAL_TABLET | ORAL | Status: DC | PRN
  Administered 2014-03-15: 1 via ORAL

## 2014-03-15 MED ORDER — PROPOFOL 10 MG/ML IV BOLUS
INTRAVENOUS | Status: DC | PRN
  Administered 2014-03-15: 150 mg via INTRAVENOUS

## 2014-03-15 SURGICAL SUPPLY — 41 items
AIRWAY NASO PHAR 26FR 6.5 (TUBING)
AIRWAY NASOPHAR 26 6.5 (TUBING) IMPLANT
ATTRACTOMAT 16X20 MAGNETIC DRP (DRAPES) IMPLANT
CANISTER SUCT 1200ML W/VALVE (MISCELLANEOUS) ×3 IMPLANT
COAGULATOR SUCT 8FR VV (MISCELLANEOUS) ×3 IMPLANT
DECANTER SPIKE VIAL GLASS SM (MISCELLANEOUS) IMPLANT
DEPRESSOR TONGUE BLADE STERILE (MISCELLANEOUS) ×6 IMPLANT
DRSG NASOPORE 8CM (GAUZE/BANDAGES/DRESSINGS) IMPLANT
DRSG TELFA 3X8 NADH (GAUZE/BANDAGES/DRESSINGS) ×3 IMPLANT
ELECT REM PT RETURN 9FT ADLT (ELECTROSURGICAL) ×3
ELECTRODE REM PT RTRN 9FT ADLT (ELECTROSURGICAL) ×1 IMPLANT
GAUZE PACKING FOLDED 2  STR (GAUZE/BANDAGES/DRESSINGS) ×2
GAUZE PACKING FOLDED 2 STR (GAUZE/BANDAGES/DRESSINGS) ×1 IMPLANT
GLOVE BIOGEL PI IND STRL 7.0 (GLOVE) ×1 IMPLANT
GLOVE BIOGEL PI INDICATOR 7.0 (GLOVE) ×2
GLOVE ECLIPSE 6.5 STRL STRAW (GLOVE) ×3 IMPLANT
GLOVE ECLIPSE 8.0 STRL XLNG CF (GLOVE) ×3 IMPLANT
GLOVE EXAM NITRILE EXT CUFF MD (GLOVE) ×3 IMPLANT
GOWN STRL REUS W/ TWL LRG LVL3 (GOWN DISPOSABLE) ×1 IMPLANT
GOWN STRL REUS W/ TWL XL LVL3 (GOWN DISPOSABLE) ×1 IMPLANT
GOWN STRL REUS W/TWL LRG LVL3 (GOWN DISPOSABLE) ×2
GOWN STRL REUS W/TWL XL LVL3 (GOWN DISPOSABLE) ×2
NEEDLE HYPO 25X1 1.5 SAFETY (NEEDLE) ×3 IMPLANT
NEEDLE SPNL 25GX3.5 QUINCKE BL (NEEDLE) ×3 IMPLANT
NS IRRIG 1000ML POUR BTL (IV SOLUTION) ×3 IMPLANT
PACK BASIN DAY SURGERY FS (CUSTOM PROCEDURE TRAY) ×3 IMPLANT
PACK ENT DAY SURGERY (CUSTOM PROCEDURE TRAY) ×3 IMPLANT
PATTIES SURGICAL .5 X3 (DISPOSABLE) ×3 IMPLANT
SHEET SIL 8X6X040 ×3 IMPLANT
SLEEVE SCD COMPRESS KNEE MED (MISCELLANEOUS) ×3 IMPLANT
SPONGE GAUZE 2X2 8PLY STER LF (GAUZE/BANDAGES/DRESSINGS) ×1
SPONGE GAUZE 2X2 8PLY STRL LF (GAUZE/BANDAGES/DRESSINGS) ×2 IMPLANT
SUT CHROMIC 3 0 PS 2 (SUTURE) IMPLANT
SUT CHROMIC 4 0 P 3 18 (SUTURE) ×3 IMPLANT
SUT ETHILON 3 0 PS 1 (SUTURE) ×3 IMPLANT
SUT PDS AB 4-0 P3 18 (SUTURE) ×3 IMPLANT
SUT PLAIN 4 0 ~~LOC~~ 1 (SUTURE) IMPLANT
SUT VIC AB 3-0 FS2 27 (SUTURE) IMPLANT
TOWEL OR 17X24 6PK STRL BLUE (TOWEL DISPOSABLE) ×6 IMPLANT
TRAY DSU PREP LF (CUSTOM PROCEDURE TRAY) ×3 IMPLANT
YANKAUER SUCT BULB TIP NO VENT (SUCTIONS) ×3 IMPLANT

## 2014-03-15 NOTE — Anesthesia Postprocedure Evaluation (Signed)
Anesthesia Post Note  Patient: Kimberly Gonzalez  Procedure(s) Performed: Procedure(s) (LRB): NASAL SEPTOPLASTY WITH TURBINATE REDUCTION BILATERAL  (Bilateral)  Anesthesia type: general  Patient location: PACU  Post pain: Pain level controlled  Post assessment: Patient's Cardiovascular Status Stable  Last Vitals:  Filed Vitals:   03/15/14 0945  BP: 114/60  Pulse: 70  Temp:   Resp: 21    Post vital signs: Reviewed and stable  Level of consciousness: sedated  Complications: No apparent anesthesia complications

## 2014-03-15 NOTE — H&P (View-Only) (Signed)
Kimberly Gonzalez, Kimberly Gonzalez 39 y.o., female [id number]     Chief Complaint: nasal obstruction, recurrent sinusitis  HPI: 6 week return visit.  She finds  Singulair seems better than Zyrtec.  She tolerated the clindamycin.  Lately, she seems to have increased postnasal drainage but on specific questioning, this may be just pharyngeal phlegm.  She is using Flonase daily.  She has increased her nasal hygiene measures.  In the past, things have typically been worse during the winter.  She is not having any further headaches.  6 week return visit for this 39 year old white female.  She continues on Singulair and Flonase and some nasal hygiene measures.  Typically, things have been worse in the past during the winter months.   For the past 5-6 days, she is feeling poorly including postnasal drainage, low-grade fever (100F), night sweats, slight hoarseness and cough.  No wheezing.  No colorful drainage or productive cough.  Preoperative visit before septoplasty and reduction of turbinates.  She has nasal obstruction, recurrent sinus infections, and possibly RIGHT contact point headaches.   I discussed the surgery in detail including risks and complications.  Questions were answered and informed consent was obtained.  Prescriptions for clindamycin and hydrocodone were written and given.  I discussed nasal hygiene, and advancement of diet and activity postop.  I will see her here one day postop for pack removal, and 10 days postop for septal splint removal.  PMH: Past Medical History  Diagnosis Date  . PONV (postoperative nausea and vomiting)     nausea  . Family history of anesthesia complication     pt's mother and sister have hx. of post-op N/V  . 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 Nasal turbinate hypertrophy 02/2014    Surg Hx: Past Surgical History  Procedure Laterality Date  . Cholecystectomy  10/30/2011    Procedure: LAPAROSCOPIC CHOLECYSTECTOMY WITH INTRAOPERATIVE CHOLANGIOGRAM;  Surgeon:  Ernestene Mention, MD;  Location: [facility];  Service: General;  Laterality: N/A;  . Lasik Bilateral 02/2001    FHx:   Family History  Problem Relation Age of Onset  . Diabetes Father   . Leukemia Father   . Cancer Father     leukemia  . Kidney disease Sister   . Diabetes Paternal Aunt   . Diabetes Paternal Uncle    SocHx:  reports that she has never smoked. She has never used smokeless tobacco. She reports that she does not drink alcohol or use illicit drugs.  ALLERGIES:  Allergies  Allergen Reactions  . Ceclor [Cefaclor] Hives  . Codeine Hives  . Erythromycin Hives  . Pseudoephedrine Other (See Comments)    HEADACHE, BLURRED VISION, RAPID HEART RATE     (Not in a hospital admission)  No results found for this or any previous visit (from the past 48 hour(s)). No results found.  Last menstrual period 02/16/2014.   BP:115/70,  Height: 5 ft 3 in, Weight: 156 lb , BMI: 27.6 kg/m2,   PHYSICAL EXAM: She is trim and healthy.  The septum is deviated to the RIGHT with a prominent chondroethmoid spur.  Pharynx is clear.  Neck unremarkable.   Lungs: Clear to auscultation Heart: Regular rate and rhythm without murmurs Abdomen: Soft, active Extremities: Normal configuration Neurologic: Symmetric, grossly intact.  Studies Reviewed:  Noncontrast CT scan of the paranasal sinuses shows a substantial rightward septal deviation with a prominent chondroethmoid spur, but otherwise completely clear sinuses.  Osteomeatal complex is patent on both sides.    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 Hypertrophy of nasal turbinates (478.0) (J34.3).  We are going to straighten your nasal septum and reduce the turbinates.  I am leaving her prescriptions for clindamycin, and hydrocodone for pain.  I will see you back here one and 10 days after surgery for removal of packs and splints respectively.  See our nasal hygiene instructions  Clindamycin HCl - 150 MG Oral Capsule;1 po qid;  Qty40; R0; Rx. Hydrocodone-Acetaminophen 5-325 MG Oral Tablet;1-2 po q4-6h prn pain; Qty40; R0; Rx  Flo ShanksWOLICKI, Cesia Orf 03/10/2014, 8:39 AM

## 2014-03-15 NOTE — Anesthesia Preprocedure Evaluation (Addendum)
Anesthesia Evaluation  Patient identified by MRN, date of birth, ID band Patient awake    Reviewed: Allergy & Precautions, H&P , NPO status , Patient's Chart, lab work & pertinent test results  History of Anesthesia Complications (+) PONV and history of anesthetic complications  Airway Mallampati: II  TM Distance: >3 FB     Dental  (+) Teeth Intact, Dental Advidsory Given   Pulmonary neg pulmonary ROS,  breath sounds clear to auscultation        Cardiovascular negative cardio ROS  Rhythm:regular Rate:Normal     Neuro/Psych negative neurological ROS  negative psych ROS   GI/Hepatic negative GI ROS, Neg liver ROS,   Endo/Other  negative endocrine ROS  Renal/GU negative Renal ROS     Musculoskeletal   Abdominal   Peds  Hematology   Anesthesia Other Findings   Reproductive/Obstetrics negative OB ROS                            Anesthesia Physical Anesthesia Plan  ASA: I  Anesthesia Plan: General ETT   Post-op Pain Management:    Induction:   Airway Management Planned:   Additional Equipment:   Intra-op Plan:   Post-operative Plan:   Informed Consent: I have reviewed the patients History and Physical, chart, labs and discussed the procedure including the risks, benefits and alternatives for the proposed anesthesia with the patient or authorized representative who has indicated his/her understanding and acceptance.   Dental Advisory Given  Plan Discussed with: Anesthesiologist, CRNA and Surgeon  Anesthesia Plan Comments:         Anesthesia Quick Evaluation

## 2014-03-15 NOTE — Transfer of Care (Signed)
Immediate Anesthesia Transfer of Care Note  Patient: Kimberly Gonzalez  Procedure(s) Performed: Procedure(s): NASAL SEPTOPLASTY WITH TURBINATE REDUCTION BILATERAL  (Bilateral)  Patient Location: PACU  Anesthesia Type:General  Level of Consciousness: awake, alert , oriented and patient cooperative  Airway & Oxygen Therapy: Patient Spontanous Breathing and Patient connected to face mask oxygen  Post-op Assessment: Report given to PACU RN and Post -op Vital signs reviewed and stable  Post vital signs: Reviewed and stable  Complications: No apparent anesthesia complications

## 2014-03-15 NOTE — Anesthesia Procedure Notes (Signed)
Procedure Name: Intubation Date/Time: 03/15/2014 7:48 AM Performed by: Susannah Carbin Pre-anesthesia Checklist: Patient identified, Emergency Drugs available, Suction available and Patient being monitored Patient Re-evaluated:Patient Re-evaluated prior to inductionOxygen Delivery Method: Circle System Utilized Preoxygenation: Pre-oxygenation with 100% oxygen Intubation Type: IV induction Ventilation: Mask ventilation without difficulty Laryngoscope Size: Mac and 3 Grade View: Grade I Tube type: Oral Tube size: 7.0 mm Number of attempts: 1 Airway Equipment and Method: stylet Placement Confirmation: ETT inserted through vocal cords under direct vision,  positive ETCO2 and breath sounds checked- equal and bilateral Secured at: 21 cm Tube secured with: Tape Dental Injury: Teeth and Oropharynx as per pre-operative assessment

## 2014-03-15 NOTE — Interval H&P Note (Signed)
History and Physical Interval Note:  03/15/2014 7:39 AM  Kimberly Gonzalez  has presented today for surgery, with the diagnosis of DEVIATED NASAL SEPTUM HYPERTROPHIC TURBINATE   The various methods of treatment have been discussed with the patient and family. After consideration of risks, benefits and other options for treatment, the patient has consented to  Procedure(s): NASAL SEPTOPLASTY WITH TURBINATE REDUCTION BILATERAL  (Bilateral) as a surgical intervention .  The patient's history has been re-reviewed, patient re-examined, no change in status, stable for surgery.  I have re-reviewed the patient's chart and labs.  Questions were answered to the patient's satisfaction.     Flo Shanks

## 2014-03-15 NOTE — Op Note (Signed)
03/15/2014  8:58 AM    Kimberly Gonzalez  [id number]   Pre-Op Dx:  Deviated Nasal Septum, Hypertrophic Inferior Turbinates  Post-op Dx: Same  Proc: Nasal Septoplasty, Bilateral SMR Inferior Turbinates   Surg:  Flo Shanks T MD  Anes:  GOT  EBL:  min  Comp:  none  Findings:  Small nose with relatively delicate tissues.  Prominent RIGHT chondroethmoid spur abutting the lateral wall of the nose.  Procedure: With the patient in a comfortable supine position,  general orotracheal anesthesia was induced without difficulty.     The patient had received preoperative Afrin spray for topical decongestion and vasoconstriction.  Intravenous prophylactic antibiotics were administered.  At an appropriate level, the patient was placed in a semi-sitting position.  A saline moistened throat pack was placed.  Nasal vibrissae were trimmed.  Afrin solution was applied with cottonoids to both sides of the septum.    1% Xylocaine with 1:100,000 epinephrine, 8 cc's, was infiltrated into the anterior floor of the nose, into the nasal spine region, into the membranous columella, and finally into the submucoperichondrial plane of the septum on both sides.  Several minutes were allowed for this to take effect.  A sterile preparation and draping of the midface was accomplished in the standard fashion.  The materials were removed from the nose and observed to be intact and correct in number.  The nose was inspected with a headlight with the findings as described above.  A LEFT hemitransfixion incision was sharply executed and carried down to the caudal edge of the quadrangular cartilage and continued to a floor incision.  An opposite small floor incision was sharply executed as well.   Floor tunnels were elevated on both sides, carried posteriorly, then medially, then brought forward along the vomer and maxillary crest.  The submucoperichondrial plane of the  LEFT septum was dissected up to the dorsum of the  nose, back onto the perpendicular plate, and brought down and communicated with a floor tunnel and then forward along the maxillary crest.  The flap was generated intact.  The chondroethmoid junction was identified and opened with a Risk analyst.  The opposite submucoperiosteal plane of the perpendicular plate of the ethmoid  was elevated and carried down to the floor tunnel posteriorly.  The superior perpendicular plate was lysed with an open Jansen-Middleton forceps.  The inferior portion was dissected from the maxillary crest and vomer with a Cottle elevator.  The midportion was rocked free with a closed [employer] forceps and then delivered.  The RIGHT chondroethmoid spur was removed.  The posterior inferior corner of the quadrangular cartilage was submucosally resected, including a cartilaginous tail up along the vomer.     The inferior edge of the caudal strut was shortened slightly to allow the septum to trapdoor into the midline.  The septum was separated from the upper lateral cartilages sharply on both sides.  After mobilizing the septum adequately, and straightening it in the standard fashion,  The septum was secured to the nasal spine with a figure-of-eight 4-0 PDS suture.  A good straight midline configuration of the septum with good dorsal support was generated.  The septal tunnel was suctioned clear.  Hemostasis was observed.  The flaps were laid back down.  The incisions were closed with interrupted 4-0 chromic suture.  Just prior to completing the septoplasty, the inferior turbinates were each infiltrated with additional 1% Xylocaine with 1:100,000 epinephrine,  4 cc's total.  Upon completing the septoplasty, beginning on the RIGHT  side, the inferior turbinate was inspected and infractured.  The anterior hood of the inferior turbinate was sharply lysed just behind the nasal valve.  The medial mucosa of the inferior turbinate was incised in an  anterior upsloping fashion and a  laterally based flap was developed from the turbinate bone.  Using angled turbinate scissors, turbinate bone and lateral mucosa were resected in a posterior downsloping fashion, taking much of the anterior pole and leaving most of the posterior pole.  Bony spicules were submucosally dissected and removed.  The mucosal flap was laid back down and the turbinate was outfractured.  The bulbous posterior pole and the cut mucosal edges were suction coagulated.  This completed one SMR inferior turbinate.  The opposite side was performed in identical fashion.  Again hemostasis was observed.  After completing both turbinate resections, 0.040" reinforced Silastic splints were fashioned, placed against the nasal septum for support, and secured thereto with a 3-0 Ethilon stitch.   Telfa packs impregnated with bacitracin ointment were placed between the septum and the inferior turbinates, one on each side, for hemostasis and support.     At this point the procedure was completed.  The pharynx was suctioned free and the throat pack was removed.   The patient was returned to anesthesia, awakened, extubated, and transferred to recovery in stable condition.  Dispo:   PACU to home  Plan: Ice, elevation, narcotic analgesia, prophylactic antibiotics for the duration of indwelling nasal foreign bodies.  We will remove the nasal packing In one day, the septal splints in 10 days.  Return to work or school in 10 days, strenuous activities in two weeks.  Cephus Richer MD

## 2014-03-15 NOTE — Discharge Instructions (Signed)
Septoplasty Care After Refer to this sheet in the next few weeks. These instructions provide you with information on caring for yourself after your procedure. Your caregiver may also give you more specific instructions. Your treatment has been planned according to current medical practices, but problems sometimes occur. Call your caregiver if you have any problems or questions after your procedure. HOME CARE INSTRUCTIONS  If antibiotic medicines are prescribed, take them as directed. Finish them even if you start to feel better.  Only take over-the-counter or prescription medicines for pain, discomfort, or fever as directed by your caregiver.  You may clean your nostrils with an over-the-counter nasal saline spray. This will help clear the crusts and blood clots in your nose. You can also make your own saline solution by mixing the following:   tsp of salt.   tsp of baking soda.  6 oz of water. If the saline solution is too irritating, you can add more water.  Do not blow your nose for 2 weeks after surgery.  Avoid strenuous activity such as running or playing sports for 2 weeks. This can cause nosebleeds.  Do not lift anything heavier than 10 pounds (4.5 kg) for 2 weeks, or as directed by your caregiver.  Use a couple pillows to elevate your head when lying down.  Eat plenty of fiber to keep your stools soft, especially while taking pain medicine. This avoids straining, which can cause a nosebleed.  You may take laxatives or stool softeners as directed by your caregiver.  Keep all follow-up appointments as directed by your caregiver. If you have nasal splints, they will be removed about 1 week after surgery. SEEK MEDICAL CARE IF:  You develop redness, swelling, or increasing pain in your nose.  You have yellowish-white fluid (pus) coming from your nose.  You have a severe headache or stiff neck.  You have severe diarrhea or persistent nausea and vomiting.  You cannot  breathe through your nose.  You have any questions or concerns. SEEK IMMEDIATE MEDICAL CARE IF:   You have a rash or upset stomach.  You have a fever.  You are short of breath.  You develop any reaction or side effects to your medicines.  You feel dizzy, or you faint.  You have vision changes or swollen eyes.  You are bleeding heavily from the nose. MAKE SURE YOU:  Understand these instructions.  Will watch your condition.  Will get help right away if you are not doing well or get worse. Document Released: 02/26/2005 Document Revised: 05/21/2011 Document Reviewed: 04/09/2011 [facility] Patient Information 2015 [place], [place]. This information is not intended to replace advice given to you by your health care provider. Make sure you discuss any questions you have with your health care provider.  You can begin with nasal hygiene measures after we remove the packing tomorrow No Flonase nasal spray for 2 weeks   Post Anesthesia Home Care Instructions  Activity: Get plenty of rest for the remainder of the day. A responsible adult should stay with you for 24 hours following the procedure.  For the next 24 hours, DO NOT: -Drive a car -Advertising copywriter -Drink alcoholic beverages -Take any medication unless instructed by your physician -Make any legal decisions or sign important papers.  Meals: Start with liquid foods such as gelatin or soup. Progress to regular foods as tolerated. Avoid greasy, spicy, heavy foods. If nausea and/or vomiting occur, drink only clear liquids until the nausea and/or vomiting subsides. Call your physician if vomiting  continues.  Special Instructions/Symptoms: Your throat may feel dry or sore from the anesthesia or the breathing tube placed in your throat during surgery. If this causes discomfort, gargle with warm salt water. The discomfort should disappear within 24 hours.

## 2014-03-16 ENCOUNTER — Encounter (HOSPITAL_BASED_OUTPATIENT_CLINIC_OR_DEPARTMENT_OTHER): Payer: Self-pay | Admitting: Otolaryngology

## 2018-04-09 ENCOUNTER — Encounter: Payer: Self-pay | Admitting: Podiatry

## 2018-04-09 ENCOUNTER — Ambulatory Visit: Payer: BLUE CROSS/BLUE SHIELD | Admitting: Podiatry

## 2018-04-09 ENCOUNTER — Other Ambulatory Visit: Payer: Self-pay

## 2018-04-09 DIAGNOSIS — L989 Disorder of the skin and subcutaneous tissue, unspecified: Secondary | ICD-10-CM

## 2018-04-16 NOTE — Progress Notes (Signed)
   Subjective: 43 year old female presenting today as a new patient with a chief complaint of painful callus lesion noted to the plantar aspects of the left foot that appeared a few months ago. She states it feels as if she is walking on a pebble. She has not done anything at home for treatment. Patient is here for further evaluation and treatment.   Past Medical History:  Diagnosis Date  . Family history of anesthesia complication    pt's mother and sister have hx. of post-op N/V  . 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 Nasal turbinate hypertrophy 02/2014  . PONV (postoperative nausea and vomiting)    nausea     Objective:  Physical Exam General: Alert and oriented x3 in no acute distress  Dermatology: Hyperkeratotic lesions present on the left foot. Pain on palpation with a central nucleated core noted. Skin is warm, dry and supple bilateral lower extremities. Negative for open lesions or macerations.  Vascular: Palpable pedal pulses bilaterally. No edema or erythema noted. Capillary refill within normal limits.  Neurological: Epicritic and protective threshold grossly intact bilaterally.   Musculoskeletal Exam: Pain on palpation at the keratotic lesion noted. Range of motion within normal limits bilateral. Muscle strength 5/5 in all groups bilateral.  Assessment: 1. Porokeratosis left foot x 3    Plan of Care:  1. Patient evaluated 2. Excisional debridement of keratoic lesion using a chisel blade was performed without incident. Salinocaine applied.  3. Dressed area with light dressing. 4. Recommended OTC corn and callus remover.  5. Patient is to return to the clinic PRN.   Felecia Shelling, DPM [facility]  Dr. Felecia Shelling, DPM    [address]                                        [place], [place] [address]                Office [phone]  Fax [phone]

## 2018-09-10 ENCOUNTER — Other Ambulatory Visit: Payer: Self-pay | Admitting: Podiatry

## 2018-09-10 ENCOUNTER — Encounter: Payer: Self-pay | Admitting: Podiatry

## 2018-09-10 ENCOUNTER — Other Ambulatory Visit: Payer: Self-pay

## 2018-09-10 ENCOUNTER — Ambulatory Visit (INDEPENDENT_AMBULATORY_CARE_PROVIDER_SITE_OTHER): Payer: BC Managed Care – PPO

## 2018-09-10 ENCOUNTER — Ambulatory Visit: Payer: BC Managed Care – PPO | Admitting: Podiatry

## 2018-09-10 VITALS — Temp 97.9°F

## 2018-09-10 DIAGNOSIS — M21619 Bunion of unspecified foot: Secondary | ICD-10-CM

## 2018-09-10 DIAGNOSIS — M21622 Bunionette of left foot: Secondary | ICD-10-CM

## 2018-09-10 DIAGNOSIS — M722 Plantar fascial fibromatosis: Secondary | ICD-10-CM | POA: Diagnosis not present

## 2018-09-10 DIAGNOSIS — M21621 Bunionette of right foot: Secondary | ICD-10-CM

## 2018-09-17 NOTE — Progress Notes (Signed)
   Subjective: 43 year old female presenting today with a chief complaint of painful bunions noted bilaterally that became symptomatic several months ago. She describes the pain as sharp, shooting and aching and is worsened with walking and wearing shoes. She also notes red spots on the bilateral feet, was seen at an urgent care and was told it was petechiae. She has not done anything for treatment of her symptoms. Patient is here for further evaluation and treatment.   Past Medical History:  Diagnosis Date  . Family history of anesthesia complication    pt's mother and sister have hx. of post-op N/V  . 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 Nasal turbinate hypertrophy 02/2014  . PONV (postoperative nausea and vomiting)    nausea     Objective: Physical Exam General: The patient is alert and oriented x3 in no acute distress.  Dermatology: Skin is cool, dry and supple bilateral lower extremities. Negative for open lesions or macerations.  Vascular: Palpable pedal pulses bilaterally. No edema or erythema noted. Capillary refill within normal limits.  Neurological: Epicritic and protective threshold grossly intact bilaterally.   Musculoskeletal Exam: Clinical evidence of bunion deformity noted to the respective foot. There is moderate pain on palpation range of motion of the first MPJ. Lateral deviation of the hallux noted consistent with hallux abductovalgus. Clinical evidence of Tailor's bunion deformity noted to the respective foot. There is a moderate pain on palpation range of motion of the fifth MPJ.   Radiographic Exam: Increased intermetatarsal angle greater than 15 with a hallux abductus angle greater than 30 noted on AP view. Moderate degenerative changes noted within the first MPJ. Increased intermetatarsal angle to the fourth interspace of the respective foot. Prominent fifth metatarsal head.  Assessment: 1. HAV w/ bunion deformity bilateral  2. Tailor's bunion deformity  bilateral  3. H/o petechiae bilateral feet    Plan of Care:  1. Patient was evaluated. X-Rays reviewed. 2. Today we discussed the conservative versus surgical management of bunion deformity. We will pursue conservative treatment at this time.  3. Recommended good shoe gear.  4. Return to clinic as needed.   [employer] cabin crew at [employer].    Edrick Kins, DPM [facility]  Dr. Edrick Kins, [address]                                        [place], [place] [address]                Office [phone]  Fax [phone]
